# Patient Record
Sex: Female | Born: 1995 | Race: Black or African American | Hispanic: No | Marital: Married | Smoking: Never smoker
Health system: Southern US, Community
[De-identification: ages and names within clinical notes are randomized; demographics above are authoritative.]

## PROBLEM LIST (undated history)

## (undated) ENCOUNTER — Inpatient Hospital Stay (HOSPITAL_COMMUNITY): Payer: Self-pay

## (undated) DIAGNOSIS — Z8669 Personal history of other diseases of the nervous system and sense organs: Secondary | ICD-10-CM

## (undated) DIAGNOSIS — O24419 Gestational diabetes mellitus in pregnancy, unspecified control: Secondary | ICD-10-CM

## (undated) DIAGNOSIS — K219 Gastro-esophageal reflux disease without esophagitis: Secondary | ICD-10-CM

## (undated) DIAGNOSIS — J45909 Unspecified asthma, uncomplicated: Secondary | ICD-10-CM

## (undated) DIAGNOSIS — D573 Sickle-cell trait: Secondary | ICD-10-CM

## (undated) DIAGNOSIS — H35109 Retinopathy of prematurity, unspecified, unspecified eye: Secondary | ICD-10-CM

## (undated) DIAGNOSIS — O1490 Unspecified pre-eclampsia, unspecified trimester: Secondary | ICD-10-CM

## (undated) DIAGNOSIS — J219 Acute bronchiolitis, unspecified: Secondary | ICD-10-CM

## (undated) HISTORY — DX: Personal history of other diseases of the nervous system and sense organs: Z86.69

## (undated) HISTORY — PX: WISDOM TOOTH EXTRACTION: SHX21

## (undated) HISTORY — DX: Retinopathy of prematurity, unspecified, unspecified eye: H35.109

## (undated) HISTORY — PX: HERNIA REPAIR: SHX51

## (undated) HISTORY — PX: INGUINAL HERNIA PEDIATRIC WITH LAPAROSCOPIC EXAM: SHX5643

## (undated) HISTORY — PX: REFRACTIVE SURGERY: SHX103

---
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
## 2013-06-25 ENCOUNTER — Emergency Department (HOSPITAL_COMMUNITY)
Admission: EM | Admit: 2013-06-25 | Discharge: 2013-06-25 | Disposition: A | Discharge status: Home / Self Care | Payer: BC Managed Care – PPO | Attending: Emergency Medicine | Admitting: Emergency Medicine

## 2013-06-25 ENCOUNTER — Encounter (HOSPITAL_COMMUNITY): Payer: Self-pay | Admitting: Emergency Medicine

## 2013-06-25 DIAGNOSIS — IMO0002 Reserved for concepts with insufficient information to code with codable children: Secondary | ICD-10-CM | POA: Insufficient documentation

## 2013-06-25 DIAGNOSIS — R109 Unspecified abdominal pain: Secondary | ICD-10-CM | POA: Insufficient documentation

## 2013-06-25 DIAGNOSIS — R000 Tachycardia, unspecified: Secondary | ICD-10-CM | POA: Insufficient documentation

## 2013-06-25 DIAGNOSIS — R197 Diarrhea, unspecified: Secondary | ICD-10-CM | POA: Insufficient documentation

## 2013-06-25 DIAGNOSIS — Z792 Long term (current) use of antibiotics: Secondary | ICD-10-CM | POA: Insufficient documentation

## 2013-06-25 DIAGNOSIS — Z3202 Encounter for pregnancy test, result negative: Secondary | ICD-10-CM | POA: Insufficient documentation

## 2013-06-25 DIAGNOSIS — R112 Nausea with vomiting, unspecified: Secondary | ICD-10-CM

## 2013-06-25 DIAGNOSIS — Z8669 Personal history of other diseases of the nervous system and sense organs: Secondary | ICD-10-CM | POA: Insufficient documentation

## 2013-06-25 DIAGNOSIS — Z8719 Personal history of other diseases of the digestive system: Secondary | ICD-10-CM | POA: Insufficient documentation

## 2013-06-25 DIAGNOSIS — Z8679 Personal history of other diseases of the circulatory system: Secondary | ICD-10-CM | POA: Insufficient documentation

## 2013-06-25 DIAGNOSIS — Z79899 Other long term (current) drug therapy: Secondary | ICD-10-CM | POA: Insufficient documentation

## 2013-06-25 DIAGNOSIS — J45909 Unspecified asthma, uncomplicated: Secondary | ICD-10-CM | POA: Insufficient documentation

## 2013-06-25 LAB — CBC WITH DIFFERENTIAL/PLATELET
BASOS PCT: 0 % (ref 0–1)
Basophils Absolute: 0 10*3/uL (ref 0.0–0.1)
EOS PCT: 1 % (ref 0–5)
Eosinophils Absolute: 0.1 10*3/uL (ref 0.0–0.7)
HEMATOCRIT: 39.4 % (ref 36.0–46.0)
HEMOGLOBIN: 13.9 g/dL (ref 12.0–15.0)
Lymphocytes Relative: 8 % — ABNORMAL LOW (ref 12–46)
Lymphs Abs: 0.7 10*3/uL (ref 0.7–4.0)
MCH: 30.5 pg (ref 26.0–34.0)
MCHC: 35.3 g/dL (ref 30.0–36.0)
MCV: 86.6 fL (ref 78.0–100.0)
MONO ABS: 0.6 10*3/uL (ref 0.1–1.0)
MONOS PCT: 6 % (ref 3–12)
NEUTROS ABS: 8 10*3/uL — AB (ref 1.7–7.7)
Neutrophils Relative %: 86 % — ABNORMAL HIGH (ref 43–77)
Platelets: 248 10*3/uL (ref 150–400)
RBC: 4.55 MIL/uL (ref 3.87–5.11)
RDW: 12 % (ref 11.5–15.5)
WBC: 9.3 10*3/uL (ref 4.0–10.5)

## 2013-06-25 LAB — URINALYSIS, ROUTINE W REFLEX MICROSCOPIC
BILIRUBIN URINE: NEGATIVE
Glucose, UA: NEGATIVE mg/dL
KETONES UR: NEGATIVE mg/dL
Leukocytes, UA: NEGATIVE
NITRITE: NEGATIVE
PROTEIN: NEGATIVE mg/dL
Specific Gravity, Urine: 1.015 (ref 1.005–1.030)
UROBILINOGEN UA: 0.2 mg/dL (ref 0.0–1.0)
pH: 5.5 (ref 5.0–8.0)

## 2013-06-25 LAB — LIPASE, BLOOD: LIPASE: 26 U/L (ref 11–59)

## 2013-06-25 LAB — URINE MICROSCOPIC-ADD ON

## 2013-06-25 LAB — COMPREHENSIVE METABOLIC PANEL
ALBUMIN: 4 g/dL (ref 3.5–5.2)
ALT: 25 U/L (ref 0–35)
AST: 22 U/L (ref 0–37)
Alkaline Phosphatase: 67 U/L (ref 39–117)
BILIRUBIN TOTAL: 0.8 mg/dL (ref 0.3–1.2)
BUN: 13 mg/dL (ref 6–23)
CALCIUM: 9.1 mg/dL (ref 8.4–10.5)
CHLORIDE: 103 meq/L (ref 96–112)
CO2: 23 meq/L (ref 19–32)
CREATININE: 0.96 mg/dL (ref 0.50–1.10)
GFR calc Af Amer: >90 mL/min (ref >90)
GFR, EST NON AFRICAN AMERICAN: 85 mL/min — AB (ref >90)
Glucose, Bld: 109 mg/dL — ABNORMAL HIGH (ref 70–99)
Potassium: 3.9 mEq/L (ref 3.7–5.3)
Sodium: 140 mEq/L (ref 137–147)
Total Protein: 8.2 g/dL (ref 6.0–8.3)

## 2013-06-25 LAB — POC URINE PREG, ED: PREG TEST UR: NEGATIVE

## 2013-06-25 MED ORDER — KETOROLAC TROMETHAMINE 30 MG/ML IJ SOLN
30.0000 mg | Freq: Once | INTRAMUSCULAR | Status: AC
  Administered 2013-06-25: 30 mg via INTRAVENOUS
  Filled 2013-06-25: qty 1

## 2013-06-25 MED ORDER — SODIUM CHLORIDE 0.9 % IV SOLN
1000.0000 mL | Freq: Once | INTRAVENOUS | Status: AC
  Administered 2013-06-25: 1000 mL via INTRAVENOUS

## 2013-06-25 MED ORDER — ONDANSETRON HCL 4 MG/2ML IJ SOLN
4.0000 mg | Freq: Once | INTRAMUSCULAR | Status: AC
  Administered 2013-06-25: 4 mg via INTRAVENOUS
  Filled 2013-06-25: qty 2

## 2013-06-25 MED ORDER — ONDANSETRON HCL 4 MG PO TABS: 4.0000 mg | ORAL_TABLET | Freq: Four times a day (QID) | ORAL | Status: DC

## 2013-06-25 NOTE — ED Provider Notes (Signed)
CSN: [id number]     Arrival date & time 06/25/13  1412 History   First MD Initiated Contact with Patient 06/25/13 1503     Chief Complaint  Patient presents with  . Abdominal Pain  . Emesis     (Consider location/radiation/quality/duration/timing/severity/associated sxs/prior Treatment) HPI  18 year old female with hx of GERD, migraine, asthma, presents c/o abd pain.  Pt reports for the past 3 days she has had intermittent headache. Described as sharp throbbing sensation affecting forehead similar to her usual tension headache.  Also report having sinus congestion which seems to worsen her headache.  She also report feeling nauseous since yesterday but today she has had >5 bouts of non bloody non bilious vomit and having loose stool.  She also currently on her 2nd day of menstrual cycle and endorse mild abdominal cramping.  Report chills.  Her primary concern is nausea and vomiting.  Has appetite but doesn't want to eat.  Denies neck stiffness, sneeze, cough, hemoptysis, back pain, dysuria, hematuria.  Only treatment tried is eating ice and drinking room temperature water and diet coke.    Past Medical History  Diagnosis Date  . ROP (retinopathy of prematurity)   . Hx of migraines   . Bronchiolitis   . Asthma   . GERD (gastroesophageal reflux disease)    Past Surgical History  Procedure Laterality Date  . Refractive surgery    . Wisdom tooth extraction    . Inguinal hernia pediatric with laparoscopic exam     Family History  Problem Relation Age of Onset  . Sickle cell trait Mother   . Hypertension Mother   . Diabetes Mother   . Deep vein thrombosis Mother   . Heart disease Other    History  Substance Use Topics  . Smoking status: Never Smoker   . Smokeless tobacco: Never Used  . Alcohol Use: No   OB History   Grav Para Term Preterm Abortions TAB SAB Ect Mult Living   0              Review of Systems  All other systems reviewed and are negative.     Allergies   Shellfish allergy  Home Medications   Prior to Admission medications   Medication Sig Start Date End Date Taking? Authorizing Provider  albuterol (PROVENTIL) (2.5 MG/3ML) 0.083% nebulizer solution Take 2.5 mg by nebulization every 6 (six) hours as needed.    Historical Provider, MD  azithromycin (ZITHROMAX) 250 MG tablet Take 1 tablet (250 mg total) by mouth daily. Take first 2 tablets together, then 1 every day until finished. 11/29/12   Hannah Muthersbaugh, PA-C  fluticasone (FLONASE) 50 MCG/ACT nasal spray Place 2 sprays into the nose daily. 11/29/12   Hannah Muthersbaugh, PA-C  medroxyPROGESTERone (DEPO-PROVERA) 150 MG/ML injection Inject 150 mg into the muscle every 3 (three) months.    Historical Provider, MD  predniSONE (DELTASONE) 20 MG tablet Take 2 tablets (40 mg total) by mouth daily. 11/29/12   Hannah Muthersbaugh, PA-C   BP 128/69  Pulse 128  Temp(Src) 98.8 F (37.1 C) (Oral)  Resp 16  SpO2 100% Physical Exam  Nursing note and vitals reviewed. Constitutional: She is oriented to person, place, and time. She appears well-developed and well-nourished. No distress.  HENT:  Head: Atraumatic.  Mouth/Throat: Oropharynx is clear and moist.  Eyes: Conjunctivae are normal.  Neck: Neck supple.  No nuchal rigidity  Cardiovascular:  Tachycardia without M/R/G  Pulmonary/Chest: Effort normal and breath sounds normal. No respiratory  distress. She has no rales. She exhibits no tenderness.  Abdominal: Soft. Bowel sounds are normal. She exhibits no distension. There is no tenderness. There is no rebound and no guarding.  Musculoskeletal: Normal range of motion.  Neurological: She is alert and oriented to person, place, and time.  Skin: No rash noted.  Psychiatric: She has a normal mood and affect.    ED Course  Procedures (including critical care time)  3:18 PM Pt with sxs suggestive of viral syndrome.  She has a benign abdomen.  Is tachycardic, IVF given, zofran for nausea.  Will  treat sxs.    4:05 PM Labs are reassuring.  UA with moderate Hgb, likely contaminant as pt currently on her menstruation.  Pt felt better and able to tolerates PO.  Pt felt comfortable to be discharge.  Will f/u with PCP, return precaution discussed.   Labs Review Labs Reviewed  CBC WITH DIFFERENTIAL - Abnormal; Notable for the following:    Neutrophils Relative % 86 (*)    Neutro Abs 8.0 (*)    Lymphocytes Relative 8 (*)    All other components within normal limits  COMPREHENSIVE METABOLIC PANEL - Abnormal; Notable for the following:    Glucose, Bld 109 (*)    GFR calc non Af Amer 85 (*)    All other components within normal limits  URINALYSIS, ROUTINE W REFLEX MICROSCOPIC - Abnormal; Notable for the following:    Hgb urine dipstick MODERATE (*)    All other components within normal limits  URINE MICROSCOPIC-ADD ON - Abnormal; Notable for the following:    Bacteria, UA FEW (*)    All other components within normal limits  LIPASE, BLOOD  POC URINE PREG, ED    Imaging Review No results found.   EKG Interpretation None      MDM   Final diagnoses:  Nausea vomiting and diarrhea    BP 128/68  Pulse 97  Temp(Src) 98.8 F (37.1 C) (Oral)  Resp 16  SpO2 100%  I have reviewed nursing notes and vital signs. I reviewed available ER/hospitalization records thought the EMR     Fayrene HelperBowie Sharell Hilmer, [place] JerseyPA-C 06/25/13 1648

## 2013-06-25 NOTE — ED Notes (Signed)
Pt given sprite 

## 2013-06-25 NOTE — ED Provider Notes (Signed)
Medical screening examination/treatment/procedure(s) were performed by non-physician practitioner and as supervising physician I was immediately available for consultation/collaboration.   EKG Interpretation None        Benny LennertJoseph L Yehudit Fulginiti, MD 06/25/13 2232

## 2013-06-25 NOTE — ED Notes (Signed)
PA at bedside.

## 2013-06-25 NOTE — ED Notes (Signed)
Pt reports headache x3 days, abdominal pain x2 days pain 4/10, pt vomited x7 today. Also on menstrual cycle. Reports dizziness as well and sinus pressure.

## 2013-06-25 NOTE — Discharge Instructions (Signed)
Nausea and Vomiting °Nausea is a sick feeling that often comes before throwing up (vomiting). Vomiting is a reflex where stomach contents come out of your mouth. Vomiting can cause severe loss of body fluids (dehydration). Children and elderly adults can become dehydrated quickly, especially if they also have diarrhea. Nausea and vomiting are symptoms of a condition or disease. It is important to find the cause of your symptoms. °CAUSES  °· Direct irritation of the stomach lining. This irritation can result from increased acid production (gastroesophageal reflux disease), infection, food poisoning, taking certain medicines (such as nonsteroidal anti-inflammatory drugs), alcohol use, or tobacco use. °· Signals from the brain. These signals could be caused by a headache, heat exposure, an inner ear disturbance, increased pressure in the brain from injury, infection, a tumor, or a concussion, pain, emotional stimulus, or metabolic problems. °· An obstruction in the gastrointestinal tract (bowel obstruction). °· Illnesses such as diabetes, hepatitis, gallbladder problems, appendicitis, kidney problems, cancer, sepsis, atypical symptoms of a heart attack, or eating disorders. °· Medical treatments such as chemotherapy and radiation. °· Receiving medicine that makes you sleep (general anesthetic) during surgery. °DIAGNOSIS °Your caregiver may ask for tests to be done if the problems do not improve after a few days. Tests may also be done if symptoms are severe or if the reason for the nausea and vomiting is not clear. Tests may include: °· Urine tests. °· Blood tests. °· Stool tests. °· Cultures (to look for evidence of infection). °· X-rays or other imaging studies. °Test results can help your caregiver make decisions about treatment or the need for additional tests. °TREATMENT °You need to stay well hydrated. Drink frequently but in small amounts. You may wish to drink water, sports drinks, clear broth, or eat frozen  ice pops or gelatin dessert to help stay hydrated. When you eat, eating slowly may help prevent nausea. There are also some antinausea medicines that may help prevent nausea. °HOME CARE INSTRUCTIONS  °· Take all medicine as directed by your caregiver. °· If you do not have an appetite, do not force yourself to eat. However, you must continue to drink fluids. °· If you have an appetite, eat a normal diet unless your caregiver tells you differently. °· Eat a variety of complex carbohydrates (rice, wheat, potatoes, bread), lean meats, yogurt, fruits, and vegetables. °· Avoid high-fat foods because they are more difficult to digest. °· Drink enough water and fluids to keep your urine clear or pale yellow. °· If you are dehydrated, ask your caregiver for specific rehydration instructions. Signs of dehydration may include: °· Severe thirst. °· Dry lips and mouth. °· Dizziness. °· Dark urine. °· Decreasing urine frequency and amount. °· Confusion. °· Rapid breathing or pulse. °SEEK IMMEDIATE MEDICAL CARE IF:  °· You have blood or brown flecks (like coffee grounds) in your vomit. °· You have black or bloody stools. °· You have a severe headache or stiff neck. °· You are confused. °· You have severe abdominal pain. °· You have chest pain or trouble breathing. °· You do not urinate at least once every 8 hours. °· You develop cold or clammy skin. °· You continue to vomit for longer than 24 to 48 hours. °· You have a fever. °MAKE SURE YOU:  °· Understand these instructions. °· Will watch your condition. °· Will get help right away if you are not doing well or get worse. °Document Released: 02/07/2005 Document Revised: 05/02/2011 Document Reviewed: 07/07/2010 °[employer]® Patient Information ©2014 [employer]. ° °Viral Gastroenteritis °Viral gastroenteritis   is also known as stomach flu. This condition affects the stomach and intestinal tract. It can cause sudden diarrhea and vomiting. The illness typically lasts 3 to 8 days.  Most people develop an immune response that eventually gets rid of the virus. While this natural response develops, the virus can make you quite ill. °CAUSES  °Many different viruses can cause gastroenteritis, such as rotavirus or noroviruses. You can catch one of these viruses by consuming contaminated food or water. You may also catch a virus by sharing utensils or other personal items with an infected person or by touching a contaminated surface. °SYMPTOMS  °The most common symptoms are diarrhea and vomiting. These problems can cause a severe loss of body fluids (dehydration) and a body salt (electrolyte) imbalance. Other symptoms may include: °· Fever. °· Headache. °· Fatigue. °· Abdominal pain. °DIAGNOSIS  °Your caregiver can usually diagnose viral gastroenteritis based on your symptoms and a physical exam. A stool sample may also be taken to test for the presence of viruses or other infections. °TREATMENT  °This illness typically goes away on its own. Treatments are aimed at rehydration. The most serious cases of viral gastroenteritis involve vomiting so severely that you are not able to keep fluids down. In these cases, fluids must be given through an intravenous line (IV). °HOME CARE INSTRUCTIONS  °· Drink enough fluids to keep your urine clear or pale yellow. Drink small amounts of fluids frequently and increase the amounts as tolerated. °· Ask your caregiver for specific rehydration instructions. °· Avoid: °· Foods high in sugar. °· Alcohol. °· Carbonated drinks. °· Tobacco. °· Juice. °· Caffeine drinks. °· Extremely hot or cold fluids. °· Fatty, greasy foods. °· Too much intake of anything at one time. °· Dairy products until 24 to 48 hours after diarrhea stops. °· You may consume probiotics. Probiotics are active cultures of beneficial bacteria. They may lessen the amount and number of diarrheal stools in adults. Probiotics can be found in yogurt with active cultures and in supplements. °· Wash your  hands well to avoid spreading the virus. °· Only take over-the-counter or prescription medicines for pain, discomfort, or fever as directed by your caregiver. Do not give aspirin to children. Antidiarrheal medicines are not recommended. °· Ask your caregiver if you should continue to take your regular prescribed and over-the-counter medicines. °· Keep all follow-up appointments as directed by your caregiver. °SEEK IMMEDIATE MEDICAL CARE IF:  °· You are unable to keep fluids down. °· You do not urinate at least once every 6 to 8 hours. °· You develop shortness of breath. °· You notice blood in your stool or vomit. This may look like coffee grounds. °· You have abdominal pain that increases or is concentrated in one small area (localized). °· You have persistent vomiting or diarrhea. °· You have a fever. °· The patient is a child younger than 3 months, and he or she has a fever. °· The patient is a child older than 3 months, and he or she has a fever and persistent symptoms. °· The patient is a child older than 3 months, and he or she has a fever and symptoms suddenly get worse. °· The patient is a baby, and he or she has no tears when crying. °MAKE SURE YOU:  °· Understand these instructions. °· Will watch your condition. °· Will get help right away if you are not doing well or get worse. °Document Released: 02/07/2005 Document Revised: 05/02/2011 Document Reviewed: 11/24/2010 °[employer]® Patient Information ©  2014 [employer]. ° °

## 2013-10-26 ENCOUNTER — Encounter (HOSPITAL_COMMUNITY): Payer: Self-pay | Admitting: Emergency Medicine

## 2013-10-26 ENCOUNTER — Emergency Department (HOSPITAL_COMMUNITY)
Admission: EM | Admit: 2013-10-26 | Discharge: 2013-10-26 | Disposition: A | Discharge status: Home / Self Care | Payer: BC Managed Care – PPO | Attending: Emergency Medicine | Admitting: Emergency Medicine

## 2013-10-26 DIAGNOSIS — K219 Gastro-esophageal reflux disease without esophagitis: Secondary | ICD-10-CM | POA: Insufficient documentation

## 2013-10-26 DIAGNOSIS — R05 Cough: Secondary | ICD-10-CM | POA: Diagnosis present

## 2013-10-26 DIAGNOSIS — J209 Acute bronchitis, unspecified: Secondary | ICD-10-CM

## 2013-10-26 DIAGNOSIS — J45901 Unspecified asthma with (acute) exacerbation: Secondary | ICD-10-CM | POA: Diagnosis not present

## 2013-10-26 DIAGNOSIS — Z79899 Other long term (current) drug therapy: Secondary | ICD-10-CM | POA: Diagnosis not present

## 2013-10-26 DIAGNOSIS — Z8669 Personal history of other diseases of the nervous system and sense organs: Secondary | ICD-10-CM | POA: Diagnosis not present

## 2013-10-26 DIAGNOSIS — R059 Cough, unspecified: Secondary | ICD-10-CM | POA: Diagnosis present

## 2013-10-26 MED ORDER — PREDNISONE 10 MG PO TABS: 20.0000 mg | ORAL_TABLET | Freq: Two times a day (BID) | ORAL | Status: DC

## 2013-10-26 MED ORDER — ALBUTEROL SULFATE (2.5 MG/3ML) 0.083% IN NEBU
5.0000 mg | INHALATION_SOLUTION | Freq: Once | RESPIRATORY_TRACT | Status: AC
  Administered 2013-10-26: 5 mg via RESPIRATORY_TRACT
  Filled 2013-10-26: qty 6

## 2013-10-26 MED ORDER — AMOXICILLIN 500 MG PO CAPS: 500.0000 mg | ORAL_CAPSULE | Freq: Three times a day (TID) | ORAL | Status: DC

## 2013-10-26 NOTE — ED Provider Notes (Signed)
CSN: [id number]     Arrival date & time 10/26/13  0037 History   First MD Initiated Contact with Patient 10/26/13 0401     Chief Complaint  Patient presents with  . URI     (Consider location/radiation/quality/duration/timing/severity/associated sxs/prior Treatment) HPI Comments: Patient is a 18 year old female with history of asthma. She presents with complaints of chest congestion and productive cough for the past 5 days. She reports several ill contacts at school. She denies fevers or chills.  Patient is a 18 y.o. female presenting with URI. The history is provided by the patient.  URI Presenting symptoms: congestion, cough, fever, rhinorrhea and sore throat   Severity:  Moderate Onset quality:  Gradual Duration:  5 days Timing:  Constant Progression:  Worsening Chronicity:  New Relieved by:  Nothing Worsened by:  Nothing tried Ineffective treatments:  None tried   Past Medical History  Diagnosis Date  . ROP (retinopathy of prematurity)   . Hx of migraines   . Bronchiolitis   . Asthma   . GERD (gastroesophageal reflux disease)    Past Surgical History  Procedure Laterality Date  . Refractive surgery    . Wisdom tooth extraction    . Inguinal hernia pediatric with laparoscopic exam     Family History  Problem Relation Age of Onset  . Sickle cell trait Mother   . Hypertension Mother   . Diabetes Mother   . Deep vein thrombosis Mother   . Heart disease Other    History  Substance Use Topics  . Smoking status: Never Smoker   . Smokeless tobacco: Never Used  . Alcohol Use: No   OB History   Grav Para Term Preterm Abortions TAB SAB Ect Mult Living   0              Review of Systems  Constitutional: Positive for fever.  HENT: Positive for congestion, rhinorrhea and sore throat.   Respiratory: Positive for cough.   All other systems reviewed and are negative.     Allergies  Shellfish allergy and Bee venom  Home Medications   Prior to Admission  medications   Medication Sig Start Date End Date Taking? Authorizing Provider  albuterol (PROVENTIL HFA;VENTOLIN HFA) 108 (90 BASE) MCG/ACT inhaler Inhale 2 puffs into the lungs every 6 (six) hours as needed for wheezing or shortness of breath.   Yes Historical Provider, MD  Multiple Vitamin (MULTIVITAMIN WITH MINERALS) TABS tablet Take 1 tablet by mouth daily.   Yes Historical Provider, MD  omeprazole (PRILOSEC OTC) 20 MG tablet Take 20 mg by mouth daily.   Yes Historical Provider, MD  diphenhydramine-acetaminophen (TYLENOL PM) 25-500 MG TABS Take 2 tablets by mouth at bedtime as needed.    Historical Provider, MD  EPINEPHrine (EPIPEN) 0.3 mg/0.3 mL IJ SOAJ injection Inject 0.3 mg into the muscle once.    Historical Provider, MD  ibuprofen (ADVIL,MOTRIN) 200 MG tablet Take 400 mg by mouth every 6 (six) hours as needed for moderate pain.    Historical Provider, MD  pseudoephedrine (SUDAFED) 30 MG tablet Take 30 mg by mouth every 4 (four) hours as needed for congestion.    Historical Provider, MD   BP 148/90  Pulse 92  Temp(Src) 99.4 F (37.4 C) (Oral)  Resp 16  Ht  (1.651 m)  Wt 187 lb (84.823 kg)  BMI 31.12 kg/m2  SpO2 100%  LMP 10/07/2013 Physical Exam  Nursing note and vitals reviewed. Constitutional: She is oriented to person, place, and time.  She appears well-developed and well-nourished. No distress.  HENT:  Head: Normocephalic and atraumatic.  Neck: Normal range of motion. Neck supple.  Cardiovascular: Normal rate and regular rhythm.  Exam reveals no gallop and no friction rub.   No murmur heard. Pulmonary/Chest: Effort normal. No respiratory distress. She has wheezes.  There are mild expiratory wheezes bilaterally.  Abdominal: Soft. Bowel sounds are normal. She exhibits no distension. There is no tenderness.  Musculoskeletal: Normal range of motion.  Neurological: She is alert and oriented to person, place, and time.  Skin: Skin is warm and dry. She is not diaphoretic.     ED Course  Procedures (including critical care time) Labs Review Labs Reviewed - No data to display  Imaging Review No results found.   EKG Interpretation None      MDM   Final diagnoses:  None    Patient with history of asthma and recurrent bronchitis. She presents with complaints of wheezing, chest congestion, productive cough, difficulty breathing. She was given an albuterol treatment with some relief. He states when she has these episodes she typically gets an antibiotic and a short course of prednisone, both of which will be provided to her. She will be discharged to home and understands to return if her symptoms worsen or change. Her vitals are stable and oxygen saturations are 100%.    Geoffery Lyons, MD 10/26/13 [phone]

## 2013-10-26 NOTE — ED Notes (Signed)
Pt presents with nasal congestion, drainage, sore throat, cough and fever x 2 days. Pt has used MDI at home and continues to have diff breathing, NAD, A & O

## 2013-10-26 NOTE — Discharge Instructions (Signed)
Amoxicillin and prednisone as prescribed.  Continue your inhaler, 2 puffs every 4 hours as needed for wheezing.  Return to the emergency department for severe chest pain, difficulty breathing, or other new and concerning symptoms.   Acute Bronchitis Bronchitis is inflammation of the airways that extend from the windpipe into the lungs (bronchi). The inflammation often causes mucus to develop. This leads to a cough, which is the most common symptom of bronchitis.  In acute bronchitis, the condition usually develops suddenly and goes away over time, usually in a couple weeks. Smoking, allergies, and asthma can make bronchitis worse. Repeated episodes of bronchitis may cause further lung problems.  CAUSES Acute bronchitis is most often caused by the same virus that causes a cold. The virus can spread from person to person (contagious) through coughing, sneezing, and touching contaminated objects. SIGNS AND SYMPTOMS   Cough.   Fever.   Coughing up mucus.   Body aches.   Chest congestion.   Chills.   Shortness of breath.   Sore throat.  DIAGNOSIS  Acute bronchitis is usually diagnosed through a physical exam. Your health care provider will also ask you questions about your medical history. Tests, such as chest X-rays, are sometimes done to rule out other conditions.  TREATMENT  Acute bronchitis usually goes away in a couple weeks. Oftentimes, no medical treatment is necessary. Medicines are sometimes given for relief of fever or cough. Antibiotic medicines are usually not needed but may be prescribed in certain situations. In some cases, an inhaler may be recommended to help reduce shortness of breath and control the cough. A cool mist vaporizer may also be used to help thin bronchial secretions and make it easier to clear the chest.  HOME CARE INSTRUCTIONS  Get plenty of rest.   Drink enough fluids to keep your urine clear or pale yellow (unless you have a medical condition  that requires fluid restriction). Increasing fluids may help thin your respiratory secretions (sputum) and reduce chest congestion, and it will prevent dehydration.   Take medicines only as directed by your health care provider.  If you were prescribed an antibiotic medicine, finish it all even if you start to feel better.  Avoid smoking and secondhand smoke. Exposure to cigarette smoke or irritating chemicals will make bronchitis worse. If you are a smoker, consider using nicotine gum or skin patches to help control withdrawal symptoms. Quitting smoking will help your lungs heal faster.   Reduce the chances of another bout of acute bronchitis by washing your hands frequently, avoiding people with cold symptoms, and trying not to touch your hands to your mouth, nose, or eyes.   Keep all follow-up visits as directed by your health care provider.  SEEK MEDICAL CARE IF: Your symptoms do not improve after 1 week of treatment.  SEEK IMMEDIATE MEDICAL CARE IF:  You develop an increased fever or chills.   You have chest pain.   You have severe shortness of breath.  You have bloody sputum.   You develop dehydration.  You faint or repeatedly feel like you are going to pass out.  You develop repeated vomiting.  You develop a severe headache. MAKE SURE YOU:   Understand these instructions.  Will watch your condition.  Will get help right away if you are not doing well or get worse. Document Released: 03/17/2004 Document Revised: 06/24/2013 Document Reviewed: 07/31/2012 [facility] Patient Information 2015 [place], [place]. This information is not intended to replace advice given to you by your health care  provider. Make sure you discuss any questions you have with your health care provider. ° °

## 2014-01-31 ENCOUNTER — Emergency Department (HOSPITAL_BASED_OUTPATIENT_CLINIC_OR_DEPARTMENT_OTHER): Payer: BC Managed Care – PPO

## 2014-01-31 ENCOUNTER — Encounter (HOSPITAL_BASED_OUTPATIENT_CLINIC_OR_DEPARTMENT_OTHER): Payer: Self-pay | Admitting: *Deleted

## 2014-01-31 ENCOUNTER — Emergency Department (HOSPITAL_BASED_OUTPATIENT_CLINIC_OR_DEPARTMENT_OTHER)
Admission: EM | Admit: 2014-01-31 | Discharge: 2014-01-31 | Disposition: A | Discharge status: Home / Self Care | Payer: BC Managed Care – PPO | Attending: Emergency Medicine | Admitting: Emergency Medicine

## 2014-01-31 DIAGNOSIS — S99922A Unspecified injury of left foot, initial encounter: Secondary | ICD-10-CM | POA: Diagnosis not present

## 2014-01-31 DIAGNOSIS — Z8669 Personal history of other diseases of the nervous system and sense organs: Secondary | ICD-10-CM | POA: Diagnosis not present

## 2014-01-31 DIAGNOSIS — Z7952 Long term (current) use of systemic steroids: Secondary | ICD-10-CM | POA: Insufficient documentation

## 2014-01-31 DIAGNOSIS — R000 Tachycardia, unspecified: Secondary | ICD-10-CM | POA: Diagnosis not present

## 2014-01-31 DIAGNOSIS — K219 Gastro-esophageal reflux disease without esophagitis: Secondary | ICD-10-CM | POA: Diagnosis not present

## 2014-01-31 DIAGNOSIS — Z8679 Personal history of other diseases of the circulatory system: Secondary | ICD-10-CM | POA: Diagnosis not present

## 2014-01-31 DIAGNOSIS — J45909 Unspecified asthma, uncomplicated: Secondary | ICD-10-CM | POA: Insufficient documentation

## 2014-01-31 DIAGNOSIS — Y9289 Other specified places as the place of occurrence of the external cause: Secondary | ICD-10-CM | POA: Insufficient documentation

## 2014-01-31 DIAGNOSIS — Z79899 Other long term (current) drug therapy: Secondary | ICD-10-CM | POA: Insufficient documentation

## 2014-01-31 DIAGNOSIS — Z792 Long term (current) use of antibiotics: Secondary | ICD-10-CM | POA: Diagnosis not present

## 2014-01-31 DIAGNOSIS — X58XXXA Exposure to other specified factors, initial encounter: Secondary | ICD-10-CM | POA: Diagnosis not present

## 2014-01-31 DIAGNOSIS — T1490XA Injury, unspecified, initial encounter: Secondary | ICD-10-CM

## 2014-01-31 DIAGNOSIS — Y9301 Activity, walking, marching and hiking: Secondary | ICD-10-CM | POA: Insufficient documentation

## 2014-01-31 DIAGNOSIS — Y998 Other external cause status: Secondary | ICD-10-CM | POA: Diagnosis not present

## 2014-01-31 DIAGNOSIS — R52 Pain, unspecified: Secondary | ICD-10-CM

## 2014-01-31 MED ORDER — ALBUTEROL SULFATE HFA 108 (90 BASE) MCG/ACT IN AERS: 2.0000 | INHALATION_SPRAY | Freq: Four times a day (QID) | RESPIRATORY_TRACT | Status: DC | PRN

## 2014-01-31 MED ORDER — ALBUTEROL SULFATE HFA 108 (90 BASE) MCG/ACT IN AERS: 2.0000 | INHALATION_SPRAY | RESPIRATORY_TRACT | Status: DC | PRN

## 2014-01-31 MED ORDER — EPINEPHRINE 0.3 MG/0.3ML IJ SOAJ: 0.3000 mg | Freq: Once | INTRAMUSCULAR | Status: DC

## 2014-01-31 MED ORDER — ACETAMINOPHEN 500 MG PO TABS
1000.0000 mg | ORAL_TABLET | Freq: Once | ORAL | Status: AC
  Administered 2014-01-31: 1000 mg via ORAL
  Filled 2014-01-31: qty 2

## 2014-01-31 NOTE — ED Notes (Signed)
Pt c/o left foot pain that began hurting yesterday. Pt was walking and had a sudden sharp pain that has been getting worse.

## 2014-01-31 NOTE — ED Provider Notes (Signed)
CSN: [id number]     Arrival date & time 01/31/14  [id number] History   None    Chief Complaint  Patient presents with  . Foot Pain     (Consider location/radiation/quality/duration/timing/severity/associated sxs/prior Treatment) HPI Patient stepped off of a curb yesterday since the event she complains of pain at left foot which is worse with walking improved with no weightbearing. No treatment prior to coming here. No other injury pain is sharp. Worse this morning. No other associated symptoms. Past Medical History  Diagnosis Date  . ROP (retinopathy of prematurity)   . Hx of migraines   . Bronchiolitis   . Asthma   . GERD (gastroesophageal reflux disease)    Past Surgical History  Procedure Laterality Date  . Refractive surgery    . Wisdom tooth extraction    . Inguinal hernia pediatric with laparoscopic exam     Family History  Problem Relation Age of Onset  . Sickle cell trait Mother   . Hypertension Mother   . Diabetes Mother   . Deep vein thrombosis Mother   . Heart disease Other    History  Substance Use Topics  . Smoking status: Never Smoker   . Smokeless tobacco: Never Used  . Alcohol Use: No   OB History    Gravida Para Term Preterm AB TAB SAB Ectopic Multiple Living   0              Review of Systems  HENT: Negative.   Musculoskeletal: Positive for arthralgias and gait problem.       Left foot pain      Allergies  Shellfish allergy and Bee venom  Home Medications   Prior to Admission medications   Medication Sig Start Date End Date Taking? Authorizing Provider  albuterol (PROVENTIL HFA;VENTOLIN HFA) 108 (90 BASE) MCG/ACT inhaler Inhale 2 puffs into the lungs every 6 (six) hours as needed for wheezing or shortness of breath.    Historical Provider, MD  amoxicillin (AMOXIL) 500 MG capsule Take 1 capsule (500 mg total) by mouth 3 (three) times daily. 10/26/13   Geoffery Lyonsouglas Delo, MD  diphenhydramine-acetaminophen (TYLENOL PM) 25-500 MG TABS Take 2 tablets by  mouth at bedtime as needed.    Historical Provider, MD  EPINEPHrine (EPIPEN) 0.3 mg/0.3 mL IJ SOAJ injection Inject 0.3 mg into the muscle once.    Historical Provider, MD  ibuprofen (ADVIL,MOTRIN) 200 MG tablet Take 400 mg by mouth every 6 (six) hours as needed for moderate pain.    Historical Provider, MD  Multiple Vitamin (MULTIVITAMIN WITH MINERALS) TABS tablet Take 1 tablet by mouth daily.    Historical Provider, MD  omeprazole (PRILOSEC OTC) 20 MG tablet Take 20 mg by mouth daily.    Historical Provider, MD  predniSONE (DELTASONE) 10 MG tablet Take 2 tablets (20 mg total) by mouth 2 (two) times daily. 10/26/13   Geoffery Lyonsouglas Delo, MD  pseudoephedrine (SUDAFED) 30 MG tablet Take 30 mg by mouth every 4 (four) hours as needed for congestion.    Historical Provider, MD   BP 132/83 mmHg  Pulse 101  Temp(Src) 98.8 F (37.1 C) (Oral)  Resp 16  Ht 5\' 5"  (1.651 m)  Wt 187 lb (84.823 kg)  BMI 31.12 kg/m2  SpO2 100%  LMP 01/25/2014 Physical Exam  Constitutional: She appears well-developed and well-nourished. No distress.  HENT:  Head: Normocephalic and atraumatic.  Eyes: EOM are normal.  Neck: Neck supple. No tracheal deviation present. No thyromegaly present.  Cardiovascular: Regular rhythm.  Mildly tachycardic  Pulmonary/Chest: Effort normal.  Abdominal: Soft. She exhibits no distension.  Musculoskeletal: Normal range of motion. She exhibits no edema.  Left lower extremity no redness no swelling. She is mildly tender at the arch of her foot. DP pulse 2+ good capillary refill. All other extremity is a redness or tenderness neurovascular intact  Neurological: She is alert. No cranial nerve deficit. Coordination normal.  Walks with a limp favoring left leg  Skin: Skin is warm and dry. No rash noted.  Psychiatric: She has a normal mood and affect.  Nursing note and vitals reviewed.   ED Course  Procedures (including critical care time) Labs Review Labs Reviewed - No data to  display  Imaging Review Dg Foot Complete Left  01/31/2014   CLINICAL DATA:  Injured foot stepping off a curb yesterday.  EXAM: LEFT FOOT - COMPLETE 3+ VIEW  COMPARISON:  None.  FINDINGS: The joint spaces are maintained. No acute fractures identified. Mild/ early hallux valgus deformity.  IMPRESSION: No acute fracture.   Electronically Signed   By: Loralie ChampagneMark  Gallerani M.D.   On: 01/31/2014 09:40     EKG Interpretation None     X-ray viewed by me MDM  Suspect sprain is sudden onset pain while walking off a curb Final diagnoses:  Pain  Injury   Plan Tylenol crutches, [place] prescriptions for EpiPen and albuterol HFA as patient requests refills. Referral Dr. Pearletha ForgeHudnall if significant pain or difficulty walking by next week Diagnosis #1 left foot sprain #2 medication refill    Doug SouSam Hadas Jessop, MD 01/31/14 [phone]

## 2014-01-31 NOTE — Discharge Instructions (Signed)
Use your crutches to keep weight off your foot. Take Tylenol for pain. Call Dr.Hudnall if you continue to have difficulty walking or significant pain in your foot by next week   Foot Sprain The muscles and cord like structures which attach muscle to bone (tendons) that surround the feet are made up of units. A foot sprain can occur at the weakest spot in any of these units. This condition is most often caused by injury to or overuse of the foot, as from playing contact sports, or aggravating a previous injury, or from poor conditioning, or obesity. SYMPTOMS  Pain with movement of the foot.  Tenderness and swelling at the injury site.  Loss of strength is present in moderate or severe sprains. THE THREE GRADES OR SEVERITY OF FOOT SPRAIN ARE:  Mild (Grade I): Slightly pulled muscle without tearing of muscle or tendon fibers or loss of strength.  Moderate (Grade II): Tearing of fibers in a muscle, tendon, or at the attachment to bone, with small decrease in strength.  Severe (Grade III): Rupture of the muscle-tendon-bone attachment, with separation of fibers. Severe sprain requires surgical repair. Often repeating (chronic) sprains are caused by overuse. Sudden (acute) sprains are caused by direct injury or over-use. DIAGNOSIS  Diagnosis of this condition is usually by your own observation. If problems continue, a caregiver may be required for further evaluation and treatment. X-rays may be required to make sure there are not breaks in the bones (fractures) present. Continued problems may require physical therapy for treatment. PREVENTION  Use strength and conditioning exercises appropriate for your sport.  Warm up properly prior to working out.  Use athletic shoes that are made for the sport you are participating in.  Allow adequate time for healing. Early return to activities makes repeat injury more likely, and can lead to an unstable arthritic foot that can result in prolonged  disability. Mild sprains generally heal in 3 to 10 days, with moderate and severe sprains taking 2 to 10 weeks. Your caregiver can help you determine the proper time required for healing. HOME CARE INSTRUCTIONS   Apply ice to the injury for 15-20 minutes, 03-04 times per day. Put the ice in a plastic bag and place a towel between the bag of ice and your skin.  An elastic wrap (like an Ace bandage) may be used to keep swelling down.  Keep foot above the level of the heart, or at least raised on a footstool, when swelling and pain are present.  Try to avoid use other than gentle range of motion while the foot is painful. Do not resume use until instructed by your caregiver. Then begin use gradually, not increasing use to the point of pain. If pain does develop, decrease use and continue the above measures, gradually increasing activities that do not cause discomfort, until you gradually achieve normal use.  Use crutches if and as instructed, and for the length of time instructed.  Keep injured foot and ankle wrapped between treatments.  Massage foot and ankle for comfort and to keep swelling down. Massage from the toes up towards the knee.  Only take over-the-counter or prescription medicines for pain, discomfort, or fever as directed by your caregiver. SEEK IMMEDIATE MEDICAL CARE IF:   Your pain and swelling increase, or pain is not controlled with medications.  You have loss of feeling in your foot or your foot turns cold or blue.  You develop new, unexplained symptoms, or an increase of the symptoms that brought you  to your caregiver. MAKE SURE YOU:   Understand these instructions.  Will watch your condition.  Will get help right away if you are not doing well or get worse. Document Released: 07/30/2001 Document Revised: 05/02/2011 Document Reviewed: 09/27/2007 [facility] Patient Information 2015 [place], [place]. This information is not intended to replace advice given to you by your  health care provider. Make sure you discuss any questions you have with your health care provider. Crutch Use Crutches are used to take weight off one of your legs or feet when you stand or walk. It is important to use crutches that fit properly. When fitted properly:  Each crutch should be 2-3 finger widths below the armpit.  Your weight should be supported by your hand, and not by resting the armpit on the crutch.  RISKS AND COMPLICATIONS Damage to the nerves that extend from your armpit to your hand and arm. To prevent this from happening, make sure your crutches fit properly and do not put pressure on your armpit when using them. HOW TO USE YOUR CRUTCHES If you have been instructed to use partial weight bearing, apply (bear) the amount of weight as your health care provider suggests. Do not bear weight in an amount that causes pain to the area of injury. Walking  Step with the crutches.  Swing the healthy leg slightly ahead of the crutches. Going Up Steps If there is no handrail:  Step up with the healthy leg.  Step up with the crutches and injured leg.  Continue in this way. If there is a handrail:  Hold both crutches in one hand.  Place your free hand on the handrail.  While putting your weight on your arms, lift your healthy leg to the step.  Bring the crutches and the injured leg up to that step.  Continue in this way. Going Down Steps Be very careful, as going down stairs with crutches is very challenging. If there is no handrail:  Step down with the injured leg and crutches.  Step down with the healthy leg. If there is a handrail:  Place your hand on the handrail.  Hold both crutches with your free hand.  Lower your injured leg and crutch to the step below you. Make sure to keep the crutch tips in the center of the step, never on the edge.  Lower your healthy leg to that step.  Continue in this way. Standing Up 1. Hold the injured leg forward. 2. Grab  the armrest with one hand and the top of the crutches with the other hand. 3. Using these supports, pull yourself up to a standing position. Sitting Down 1. Hold the injured leg forward. 2. Grab the armrest with one hand and the top of the crutches with the other hand. 3. Lower yourself to a sitting position. SEEK MEDICAL CARE IF:  You still feel unsteady on your feet.  You develop new pain, for example in your armpits, back, shoulder, wrist, or hip.  You develop any numbness or tingling. SEEK IMMEDIATE MEDICAL CARE IF: You fall. Document Released: 02/05/2000 Document Revised: 02/12/2013 Document Reviewed: 10/15/2012 [facility] Patient Information 2015 [place], [place]. This information is not intended to replace advice given to you by your health care provider. Make sure you discuss any questions you have with your health care provider.

## 2014-02-03 ENCOUNTER — Ambulatory Visit: Payer: BC Managed Care – PPO | Admitting: Family Medicine

## 2014-04-30 ENCOUNTER — Other Ambulatory Visit (HOSPITAL_COMMUNITY)
Admission: RE | Admit: 2014-04-30 | Discharge: 2014-04-30 | Disposition: A | Discharge status: Home / Self Care | Payer: BLUE CROSS/BLUE SHIELD | Source: Ambulatory Visit | Attending: Gynecology | Admitting: Gynecology

## 2014-04-30 ENCOUNTER — Ambulatory Visit (INDEPENDENT_AMBULATORY_CARE_PROVIDER_SITE_OTHER): Payer: BLUE CROSS/BLUE SHIELD | Admitting: Women's Health

## 2014-04-30 ENCOUNTER — Encounter: Payer: Self-pay | Admitting: Women's Health

## 2014-04-30 VITALS — BP 132/80 | Ht 66.0 in | Wt 199.0 lb

## 2014-04-30 DIAGNOSIS — N760 Acute vaginitis: Secondary | ICD-10-CM

## 2014-04-30 DIAGNOSIS — Z01419 Encounter for gynecological examination (general) (routine) without abnormal findings: Secondary | ICD-10-CM

## 2014-04-30 DIAGNOSIS — Z30018 Encounter for initial prescription of other contraceptives: Secondary | ICD-10-CM | POA: Diagnosis not present

## 2014-04-30 DIAGNOSIS — A499 Bacterial infection, unspecified: Secondary | ICD-10-CM

## 2014-04-30 DIAGNOSIS — Z833 Family history of diabetes mellitus: Secondary | ICD-10-CM

## 2014-04-30 DIAGNOSIS — Z113 Encounter for screening for infections with a predominantly sexual mode of transmission: Secondary | ICD-10-CM

## 2014-04-30 DIAGNOSIS — B9689 Other specified bacterial agents as the cause of diseases classified elsewhere: Secondary | ICD-10-CM

## 2014-04-30 LAB — CBC WITH DIFFERENTIAL/PLATELET
Basophils Absolute: 0.1 10*3/uL (ref 0.0–0.1)
Basophils Relative: 1 % (ref 0–1)
EOS ABS: 0.3 10*3/uL (ref 0.0–0.7)
Eosinophils Relative: 4 % (ref 0–5)
HCT: 37.7 % (ref 36.0–46.0)
HEMOGLOBIN: 12.5 g/dL (ref 12.0–15.0)
Lymphocytes Relative: 36 % (ref 12–46)
Lymphs Abs: 2.3 10*3/uL (ref 0.7–4.0)
MCH: 29.9 pg (ref 26.0–34.0)
MCHC: 33.2 g/dL (ref 30.0–36.0)
MCV: 90.2 fL (ref 78.0–100.0)
MONO ABS: 0.5 10*3/uL (ref 0.1–1.0)
MONOS PCT: 8 % (ref 3–12)
MPV: 10.2 fL (ref 8.6–12.4)
Neutro Abs: 3.3 10*3/uL (ref 1.7–7.7)
Neutrophils Relative %: 51 % (ref 43–77)
PLATELETS: 272 10*3/uL (ref 150–400)
RBC: 4.18 MIL/uL (ref 3.87–5.11)
RDW: 13.2 % (ref 11.5–15.5)
WBC: 6.5 10*3/uL (ref 4.0–10.5)

## 2014-04-30 LAB — WET PREP FOR TRICH, YEAST, CLUE
TRICH WET PREP: NONE SEEN
WBC, Wet Prep HPF POC: NONE SEEN
YEAST WET PREP: NONE SEEN

## 2014-04-30 LAB — GLUCOSE, RANDOM: Glucose, Bld: 83 mg/dL (ref 70–99)

## 2014-04-30 MED ORDER — METRONIDAZOLE 500 MG PO TABS: 500.0000 mg | ORAL_TABLET | Freq: Two times a day (BID) | ORAL | Status: DC

## 2014-04-30 MED ORDER — ETONOGESTREL-ETHINYL ESTRADIOL 0.12-0.015 MG/24HR VA RING: VAGINAL_RING | VAGINAL | Status: AC

## 2014-04-30 NOTE — Progress Notes (Signed)
Darrold SpanMalaunia B Williams 1996/01/20 [id number]    History:    Presents for annual exam.  Regular monthly cycle condoms, used Depo-Provera in the past. Gardasil series completed. Has had problems with recurrent bacteria vaginosis. 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  Past medical history, past surgical history, family history and social history were all reviewed and documented in the EPIC chart. [place] [facility] [place] [facility] [place] Mother diabetes.  ROS:  A ROS was performed and pertinent positives and negatives are included.  Exam:  Filed Vitals:   04/30/14 1553  BP: 132/80    General appearance:  Normal Thyroid:  Symmetrical, normal in size, without palpable masses or nodularity. Respiratory  Auscultation:  Clear without wheezing or rhonchi Cardiovascular  Auscultation:  Regular rate, without rubs, murmurs or gallops  Edema/varicosities:  Not grossly evident Abdominal  Soft,nontender, without masses, guarding or rebound.  Liver/spleen:  No organomegaly noted  Hernia:  None appreciated  Skin  Inspection:  Grossly normal   Breasts: Examined lying and sitting/bilateral piercing.     Right: Without masses, retractions, discharge or axillary adenopathy.     Left: Without masses, retractions, discharge or axillary adenopathy. Gentitourinary   Inguinal/mons:  Normal without inguinal adenopathy  External genitalia:  Normal clitoris piercing  BUS/Urethra/Skene's glands:  Normal  Vagina:  Scant discharge, wet prep positive for amines, clues, TNTC bacteria  Cervix:  Normal  Uterus:  normal in size, shape and contour.  Midline and mobile  Adnexa/parametria:     Rt: Without masses or tenderness.   Lt: Without masses or tenderness.  Anus and perineum: Normal    Assessment/Plan:  19 y.o. SBF G0 for annual exam with complaint of recurrent bacteria vaginosis.  Bacteria vaginosis Monthly cycle/Contraception management STD screen  Plan: Flagyl 500 twice daily for 7  days, alcohol precautions reviewed. Options reviewed, will try boric acid 600 mg TWICE weekly per vagina after symptoms resolve. Instructed to call if no relief of symptoms. Contraception options reviewed, NuvaRing prescription, proper use given and reviewed slight risk for blood clots and strokes. Cycle just ended will start today, condoms especially first month and  for infection control. SBE's, regular exercise, calcium rich diet, continue Weight Watchers for continued weight loss. CBC, glucose, UA, Pap, new screening guidelines reviewed. GC/Chlamydia, HIV, hep B, C, RPRHarrington Challenger.  Royal Vandevoort [facility] LLCWHNP, 4:37 PM 04/30/2014

## 2014-04-30 NOTE — Patient Instructions (Signed)

## 2014-05-01 LAB — URINALYSIS W MICROSCOPIC + REFLEX CULTURE
Bacteria, UA: NONE SEEN
Bilirubin Urine: NEGATIVE
Casts: NONE SEEN
Crystals: NONE SEEN
Glucose, UA: NEGATIVE mg/dL
Ketones, ur: NEGATIVE mg/dL
Leukocytes, UA: NEGATIVE
Nitrite: NEGATIVE
PH: 5.5 (ref 5.0–8.0)
Protein, ur: NEGATIVE mg/dL
SQUAMOUS EPITHELIAL / LPF: NONE SEEN
Specific Gravity, Urine: 1.012 (ref 1.005–1.030)
Urobilinogen, UA: 0.2 mg/dL (ref 0.0–1.0)

## 2014-05-01 LAB — HIV ANTIBODY (ROUTINE TESTING W REFLEX): HIV 1&2 Ab, 4th Generation: NONREACTIVE

## 2014-05-01 LAB — GC/CHLAMYDIA PROBE AMP
CT Probe RNA: NEGATIVE
GC PROBE AMP APTIMA: NEGATIVE

## 2014-05-01 LAB — HEPATITIS C ANTIBODY: HCV AB: NEGATIVE

## 2014-05-01 LAB — RPR

## 2014-05-01 LAB — HEPATITIS B SURFACE ANTIGEN: Hepatitis B Surface Ag: NEGATIVE

## 2014-05-02 LAB — CYTOLOGY - PAP

## 2014-05-10 ENCOUNTER — Emergency Department (HOSPITAL_COMMUNITY): Payer: BLUE CROSS/BLUE SHIELD

## 2014-05-10 ENCOUNTER — Emergency Department (HOSPITAL_COMMUNITY)
Admission: EM | Admit: 2014-05-10 | Discharge: 2014-05-10 | Disposition: A | Discharge status: Home / Self Care | Payer: BLUE CROSS/BLUE SHIELD | Attending: Emergency Medicine | Admitting: Emergency Medicine

## 2014-05-10 ENCOUNTER — Encounter (HOSPITAL_COMMUNITY): Payer: Self-pay | Admitting: *Deleted

## 2014-05-10 DIAGNOSIS — K219 Gastro-esophageal reflux disease without esophagitis: Secondary | ICD-10-CM | POA: Insufficient documentation

## 2014-05-10 DIAGNOSIS — W102XXA Fall (on)(from) incline, initial encounter: Secondary | ICD-10-CM | POA: Insufficient documentation

## 2014-05-10 DIAGNOSIS — Y9289 Other specified places as the place of occurrence of the external cause: Secondary | ICD-10-CM | POA: Diagnosis not present

## 2014-05-10 DIAGNOSIS — Z79899 Other long term (current) drug therapy: Secondary | ICD-10-CM | POA: Insufficient documentation

## 2014-05-10 DIAGNOSIS — S93402A Sprain of unspecified ligament of left ankle, initial encounter: Secondary | ICD-10-CM | POA: Insufficient documentation

## 2014-05-10 DIAGNOSIS — Z8669 Personal history of other diseases of the nervous system and sense organs: Secondary | ICD-10-CM | POA: Insufficient documentation

## 2014-05-10 DIAGNOSIS — S99912A Unspecified injury of left ankle, initial encounter: Secondary | ICD-10-CM | POA: Diagnosis present

## 2014-05-10 DIAGNOSIS — Y9389 Activity, other specified: Secondary | ICD-10-CM | POA: Diagnosis not present

## 2014-05-10 DIAGNOSIS — J45909 Unspecified asthma, uncomplicated: Secondary | ICD-10-CM | POA: Insufficient documentation

## 2014-05-10 DIAGNOSIS — Z8679 Personal history of other diseases of the circulatory system: Secondary | ICD-10-CM | POA: Diagnosis not present

## 2014-05-10 DIAGNOSIS — Y998 Other external cause status: Secondary | ICD-10-CM | POA: Insufficient documentation

## 2014-05-10 MED ORDER — HYDROCODONE-ACETAMINOPHEN 5-325 MG PO TABS: 1.0000 | ORAL_TABLET | ORAL | Status: DC | PRN

## 2014-05-10 MED ORDER — OXYCODONE-ACETAMINOPHEN 5-325 MG PO TABS
1.0000 | ORAL_TABLET | Freq: Once | ORAL | Status: AC
  Administered 2014-05-10: 1 via ORAL
  Filled 2014-05-10 (×2): qty 1

## 2014-05-10 MED ORDER — IBUPROFEN 600 MG PO TABS: 600.0000 mg | ORAL_TABLET | Freq: Four times a day (QID) | ORAL | Status: DC | PRN

## 2014-05-10 NOTE — ED Notes (Signed)
Bed: [id number] Expected date: 05/10/14 Expected time: 2:08 PM Means of arrival: Ambulance Comments: Ankle deformity

## 2014-05-10 NOTE — ED Provider Notes (Signed)
CSN: [id number]     Arrival date & time 05/10/14  1419 History   First MD Initiated Contact with Patient 05/10/14 1459     Chief Complaint  Patient presents with  . Ankle Pain     (Consider location/radiation/quality/duration/timing/severity/associated sxs/prior Treatment) The history is provided by the patient and medical records.    This is a 19 y.o. F who presents today with a chief complaint of left ankle pain. She was stepping off her front porch when she twisted her ankle and fell to her knees. She states that her ankle "rolled backwards" and she heard a loud pop. No head injury or LOC.  She has been unable to bear weight on the left ankle since fall. EMS stabilized her ankle and she received Percocet for pain in the ED. She denies numbness, paresthesias, or weakness of left leg or foot.  States she did have prior operative repair on the arch of her left foot when she was younger, no other surgeries.  VSS.  Past Medical History  Diagnosis Date  . ROP (retinopathy of prematurity)   . Hx of migraines   . Bronchiolitis   . Asthma   . GERD (gastroesophageal reflux disease)    Past Surgical History  Procedure Laterality Date  . Refractive surgery    . Wisdom tooth extraction    . Inguinal hernia pediatric with laparoscopic exam     Family History  Problem Relation Age of Onset  . Sickle cell trait Mother   . Hypertension Mother   . Diabetes Mother   . Deep vein thrombosis Mother   . Heart disease Other    History  Substance Use Topics  . Smoking status: Never Smoker   . Smokeless tobacco: Never Used  . Alcohol Use: No   OB History    Gravida Para Term Preterm AB TAB SAB Ectopic Multiple Living   0              Review of Systems  Musculoskeletal: Positive for joint swelling and arthralgias.  All other systems reviewed and are negative.     Allergies  Bee venom and Shellfish allergy  Home Medications   Prior to Admission medications   Medication Sig Start  Date End Date Taking? Authorizing Provider  albuterol (PROVENTIL HFA;VENTOLIN HFA) 108 (90 BASE) MCG/ACT inhaler Inhale 2 puffs into the lungs every 4 (four) hours as needed for wheezing or shortness of breath.   Yes Historical Provider, MD  BIOTIN PO Take 1 tablet by mouth daily.   Yes Historical Provider, MD  EPINEPHrine (EPIPEN) 0.3 mg/0.3 mL IJ SOAJ injection Inject 0.3 mg into the muscle once.   Yes Historical Provider, MD  etonogestrel-ethinyl estradiol (NUVARING) 0.12-0.015 MG/24HR vaginal ring Insert vaginally and leave in place for 3 consecutive weeks, then remove for 1 week. 04/30/14  Yes Harrington Challenger, NP  FOLIC ACID PO Take 1 tablet by mouth daily.   Yes Historical Provider, MD  Multiple Vitamin (MULTIVITAMIN WITH MINERALS) TABS tablet Take 1 tablet by mouth daily.   Yes Historical Provider, MD  omeprazole (PRILOSEC OTC) 20 MG tablet Take 20 mg by mouth daily as needed (GERD).    Yes Historical Provider, MD  metroNIDAZOLE (FLAGYL) 500 MG tablet Take 1 tablet (500 mg total) by mouth 2 (two) times daily. Patient not taking: Reported on 05/10/2014 04/30/14   Harrington Challenger, NP   BP 123/74 mmHg  Pulse 83  Temp(Src) 99.3 F (37.4 C) (Oral)  Resp 18  SpO2  100%  LMP 04/26/2014   Physical Exam  Constitutional: She is oriented to person, place, and time. She appears well-developed and well-nourished.  HENT:  Head: Normocephalic and atraumatic.  Mouth/Throat: Oropharynx is clear and moist.  Eyes: Conjunctivae and EOM are normal. Pupils are equal, round, and reactive to light.  Neck: Normal range of motion.  Cardiovascular: Normal rate, regular rhythm and normal heart sounds.   Pulmonary/Chest: Effort normal and breath sounds normal.  Abdominal: Soft. Bowel sounds are normal.  Musculoskeletal: She exhibits tenderness.       Left ankle: She exhibits decreased range of motion and swelling. She exhibits no ecchymosis. Tenderness. Lateral malleolus tenderness found.       Feet:  Left lateral  ankle with tenderness an swelling present; no gross bony deformities; decreased ROM due to pain; calf compartments remain soft; full ROM of left knee and all toes; strong DP pulse and cap refill; normal sensation  Neurological: She is alert and oriented to person, place, and time.  Skin: Skin is warm and dry.  Psychiatric: She has a normal mood and affect. Her behavior is normal.  Nursing note and vitals reviewed.   ED Course  Procedures (including critical care time) Labs Review Labs Reviewed - No data to display  Imaging Review Dg Ankle Complete Left  05/10/2014   CLINICAL DATA:  [age] year old female acute fall, twisted ankle left ankle injury, pain and swelling. Initial encounter.  EXAM: LEFT ANKLE COMPLETE - 3+ VIEW  COMPARISON:  01/31/2014 foot radiograph  FINDINGS: There is no evidence of fracture, dislocation, or joint effusion. There is no evidence of arthropathy or other focal bone abnormality. Soft tissues are unremarkable.  IMPRESSION: Negative.   Electronically Signed   By: Harmon PierJeffrey  Hu M.D.   On: 05/10/2014 15:53     EKG Interpretation None      MDM   Final diagnoses:  Fall (on)(from) incline, initial encounter  Left ankle sprain, initial encounter   19 y.o. F with fall from porch twisting her left ankle.  No head injury or LOC.  Patient with swelling around lateral malleolus, no gross deformities.  Leg and foot remains NVI.  X-ray negative for acute/fx dislocation.  Patient placed in ASO splint, she has crutches at home to use.  Rx motrin and vicodin.  Referral to orthopedics given for any worsening symptoms or no improvement within 1 week.  Discussed plan with patient, he/she acknowledged understanding and agreed with plan of care.  Return precautions given for new or worsening symptoms.  Garlon HatchetLisa M Kupono Marling, PA-C 05/10/14 1651  Cathren LaineKevin Steinl, MD 05/11/14 [phone]

## 2014-05-10 NOTE — ED Notes (Signed)
Pt from home via EMS-Per EMS pt was leaving for work and fell in her yard. Pt sts that she heard a pop in her L ankle. Pt has obvious swelling and possible deformity. Pt has good pulses and circulation. Pt did not hit head. Pt had no LOC. Pt is A&O and in NAD

## 2014-05-10 NOTE — Discharge Instructions (Signed)
Take the prescribed medication as directed for pain control.  May wish to ice and elevate ankle at home to help with pain/swelling as well. Start non-weight bearing on crutches and progress back to full weight bearing as tolerated. Follow-up with orthopedics, Dr. Charlann Boxerlin if symptoms worsening or no improvement in 1 week. Return to the ED for new concerns.

## 2014-05-10 NOTE — ED Notes (Signed)
Ortho Tech called for ASO placement

## 2014-05-15 ENCOUNTER — Encounter (HOSPITAL_BASED_OUTPATIENT_CLINIC_OR_DEPARTMENT_OTHER): Payer: Self-pay | Admitting: *Deleted

## 2014-05-15 ENCOUNTER — Emergency Department (HOSPITAL_BASED_OUTPATIENT_CLINIC_OR_DEPARTMENT_OTHER)
Admission: EM | Admit: 2014-05-15 | Discharge: 2014-05-15 | Disposition: A | Discharge status: Home / Self Care | Payer: BLUE CROSS/BLUE SHIELD | Attending: Emergency Medicine | Admitting: Emergency Medicine

## 2014-05-15 ENCOUNTER — Emergency Department (HOSPITAL_BASED_OUTPATIENT_CLINIC_OR_DEPARTMENT_OTHER): Payer: BLUE CROSS/BLUE SHIELD

## 2014-05-15 DIAGNOSIS — S93402A Sprain of unspecified ligament of left ankle, initial encounter: Secondary | ICD-10-CM | POA: Diagnosis not present

## 2014-05-15 DIAGNOSIS — K219 Gastro-esophageal reflux disease without esophagitis: Secondary | ICD-10-CM | POA: Diagnosis not present

## 2014-05-15 DIAGNOSIS — Y9389 Activity, other specified: Secondary | ICD-10-CM | POA: Insufficient documentation

## 2014-05-15 DIAGNOSIS — S93402D Sprain of unspecified ligament of left ankle, subsequent encounter: Secondary | ICD-10-CM

## 2014-05-15 DIAGNOSIS — Z8669 Personal history of other diseases of the nervous system and sense organs: Secondary | ICD-10-CM | POA: Diagnosis not present

## 2014-05-15 DIAGNOSIS — Y998 Other external cause status: Secondary | ICD-10-CM | POA: Insufficient documentation

## 2014-05-15 DIAGNOSIS — W08XXXA Fall from other furniture, initial encounter: Secondary | ICD-10-CM | POA: Diagnosis not present

## 2014-05-15 DIAGNOSIS — Y9289 Other specified places as the place of occurrence of the external cause: Secondary | ICD-10-CM | POA: Insufficient documentation

## 2014-05-15 DIAGNOSIS — S99912A Unspecified injury of left ankle, initial encounter: Secondary | ICD-10-CM | POA: Diagnosis present

## 2014-05-15 DIAGNOSIS — J45909 Unspecified asthma, uncomplicated: Secondary | ICD-10-CM | POA: Diagnosis not present

## 2014-05-15 NOTE — ED Notes (Signed)
Slipped and fell off porch. Injury to her left ankle. [facility]. She is having pain in her lower leg.

## 2014-05-15 NOTE — Discharge Instructions (Signed)

## 2014-05-15 NOTE — ED Provider Notes (Signed)
CSN: [id number]     Arrival date & time 05/15/14  1758 History  This chart was scribed for Gilda Creasehristopher J Taysean Wager, MD by Roxy Cedarhandni Bhalodia, ED Scribe. This patient was seen in room MHFT1/MHFT1 and the patient's care was started at 6:38 PM.   Chief Complaint  Patient presents with  . Ankle Injury   Patient is a 19 y.o. female presenting with lower extremity injury. The history is provided by the patient. No language interpreter was used.  Ankle Injury This is a recurrent problem. The current episode started more than 2 days ago. The problem has not changed since onset.Pertinent negatives include no chest pain, no abdominal pain, no headaches and no shortness of breath. Nothing aggravates the symptoms. Nothing relieves the symptoms.   HPI Comments: Charlene Crosby is a 19 y.o. female with a PMHx of migraines, bronchiolitis, asthma, GERD and retinopathy of prematurity, who presents to the Emergency Department complaining of moderate left ankle pain that began 6 days ago after patient slipped and fell off of her porch. [facility] negative x[facility]. Patient complains of associated pain to left lower leg today. Mother complains that patient had an xray done of her ankle and not her entire left leg. Patient states that she has pain in her left ankle with associated pain shooting up the lateral side of her left lower leg.  Past Medical History  Diagnosis Date  . ROP (retinopathy of prematurity)   . Hx of migraines   . Bronchiolitis   . Asthma   . GERD (gastroesophageal reflux disease)    Past Surgical History  Procedure Laterality Date  . Refractive surgery    . Wisdom tooth extraction    . Inguinal hernia pediatric with laparoscopic exam     Family History  Problem Relation Age of Onset  . Sickle cell trait Mother   . Hypertension Mother   . Diabetes Mother   . Deep vein thrombosis Mother   . Heart disease Other    History  Substance Use Topics  .  Smoking status: Never Smoker   . Smokeless tobacco: Never Used  . Alcohol Use: No   OB History    Gravida Para Term Preterm AB TAB SAB Ectopic Multiple Living   0              Review of Systems  Respiratory: Negative for shortness of breath.   Cardiovascular: Negative for chest pain.  Gastrointestinal: Negative for abdominal pain.  Musculoskeletal: Positive for myalgias, joint swelling and arthralgias.  Neurological: Negative for headaches.  All other systems reviewed and are negative.  Allergies  Bee venom and Shellfish allergy  Home Medications   Prior to Admission medications   Medication Sig Start Date End Date Taking? Authorizing Provider  albuterol (PROVENTIL HFA;VENTOLIN HFA) 108 (90 BASE) MCG/ACT inhaler Inhale 2 puffs into the lungs every 4 (four) hours as needed for wheezing or shortness of breath.    Historical Provider, MD  BIOTIN PO Take 1 tablet by mouth daily.    Historical Provider, MD  EPINEPHrine (EPIPEN) 0.3 mg/0.3 mL IJ SOAJ injection Inject 0.3 mg into the muscle once.    Historical Provider, MD  etonogestrel-ethinyl estradiol (NUVARING) 0.12-0.015 MG/24HR vaginal ring Insert vaginally and leave in place for 3 consecutive weeks, then remove for 1 week. 04/30/14   Harrington ChallengerNancy J Young, NP  FOLIC ACID PO Take 1 tablet by mouth daily.    Historical Provider, MD  HYDROcodone-acetaminophen (  NORCO/VICODIN) 5-325 MG per tablet Take 1 tablet by mouth every 4 (four) hours as needed. 05/10/14   Garlon Hatchet, PA-C  ibuprofen (ADVIL,MOTRIN) 600 MG tablet Take 1 tablet (600 mg total) by mouth every 6 (six) hours as needed. 05/10/14   Garlon Hatchet, PA-C  metroNIDAZOLE (FLAGYL) 500 MG tablet Take 1 tablet (500 mg total) by mouth 2 (two) times daily. Patient not taking: Reported on 05/10/2014 04/30/14   Harrington Challenger, NP  Multiple Vitamin (MULTIVITAMIN WITH MINERALS) TABS tablet Take 1 tablet by mouth daily.    Historical Provider, MD  omeprazole (PRILOSEC OTC) 20 MG tablet Take 20 mg by  mouth daily as needed (GERD).     Historical Provider, MD   Triage Vitals: BP 138/84 mmHg  Pulse 89  Temp(Src) 98.7 F (37.1 C) (Oral)  Resp 20  Ht  (1.676 m)  Wt 199 lb (90.266 kg)  BMI 32.13 kg/m2  SpO2 100%  LMP 04/26/2014  Physical Exam  Constitutional: She is oriented to person, place, and time. She appears well-developed and well-nourished. No distress.  HENT:  Head: Normocephalic and atraumatic.  Right Ear: Hearing normal.  Left Ear: Hearing normal.  Nose: Nose normal.  Mouth/Throat: Oropharynx is clear and moist and mucous membranes are normal.  Eyes: Conjunctivae and EOM are normal. Pupils are equal, round, and reactive to light.  Neck: Normal range of motion. Neck supple.  Cardiovascular: Regular rhythm, S1 normal and S2 normal.  Exam reveals no gallop and no friction rub.   No murmur heard. Pulmonary/Chest: Effort normal and breath sounds normal. No respiratory distress. She exhibits no tenderness.  Abdominal: Soft. Normal appearance and bowel sounds are normal. There is no hepatosplenomegaly. There is no tenderness. There is no rebound, no guarding, no tenderness at McBurney's point and negative Murphy's sign. No hernia.  Musculoskeletal: Normal range of motion. She exhibits tenderness.       Left ankle: Tenderness. AITFL tenderness found.  Neurological: She is alert and oriented to person, place, and time. She has normal strength. No cranial nerve deficit or sensory deficit. Coordination normal. GCS eye subscore is 4. GCS verbal subscore is 5. GCS motor subscore is 6.  Skin: Skin is warm, dry and intact. No rash noted. No cyanosis.  Psychiatric: She has a normal mood and affect. Her speech is normal and behavior is normal. Thought content normal.  Nursing note and vitals reviewed.  ED Course  Procedures (including critical care time)  DIAGNOSTIC STUDIES: Oxygen Saturation is 100% on RA, normal by my interpretation.    COORDINATION OF CARE: 6:41 PM- Discussed  plans to order diagnostic imaging of left tibia/fibula. Discussed negative xray with patient. Will give her medication for pain management. Will refer patient to orthopedist. Pt advised of plan for treatment and pt agrees.  Labs Review Labs Reviewed - No data to display  Imaging Review Dg Tibia/fibula Left  05/15/2014   CLINICAL DATA:  Post fall from front porch on Saturday with persistent lateral sided ankle pain radiating up the leg.  EXAM: LEFT TIBIA AND FIBULA - 2 VIEW  COMPARISON:  Left ankle radiographs - 05/10/2014  FINDINGS: No fracture or dislocation. A suspected bone island is incidentally noted within the mid/inferior tibial diaphysis. No associated periostitis. Limited visualization of the adjacent knee and ankle is normal given obliquity. Regional soft tissues appear normal. No radiopaque foreign body.  IMPRESSION: No explanation for patient's persistent lower extremity pain.   Electronically Signed   By: Simonne Come  M.D.   On: 05/15/2014 18:40     EKG Interpretation None     MDM   Final diagnoses:  Ankle sprain, left, subsequent encounter    Patient with persistent pain in the ankle. Repeat x-ray did not show any acute abnormality. Patient does have tenderness, swelling in the area of the lateral ligaments. Will refer to orthopedics. Cam Walker for comfort.  I personally performed the services described in this documentation, which was scribed in my presence. The recorded information has been reviewed and is accurate.    Gilda Crease, MD 05/15/14 [phone]

## 2014-05-20 ENCOUNTER — Ambulatory Visit: Payer: BLUE CROSS/BLUE SHIELD | Admitting: Women's Health

## 2014-05-23 ENCOUNTER — Ambulatory Visit: Payer: BLUE CROSS/BLUE SHIELD | Admitting: Women's Health

## 2014-05-27 ENCOUNTER — Encounter: Payer: Self-pay | Admitting: Women's Health

## 2014-05-27 ENCOUNTER — Ambulatory Visit (INDEPENDENT_AMBULATORY_CARE_PROVIDER_SITE_OTHER): Payer: BLUE CROSS/BLUE SHIELD | Admitting: Women's Health

## 2014-05-27 VITALS — BP 122/80 | Ht 66.0 in | Wt 195.0 lb

## 2014-05-27 DIAGNOSIS — A499 Bacterial infection, unspecified: Secondary | ICD-10-CM | POA: Diagnosis not present

## 2014-05-27 DIAGNOSIS — N760 Acute vaginitis: Secondary | ICD-10-CM

## 2014-05-27 DIAGNOSIS — R350 Frequency of micturition: Secondary | ICD-10-CM

## 2014-05-27 DIAGNOSIS — B9689 Other specified bacterial agents as the cause of diseases classified elsewhere: Secondary | ICD-10-CM

## 2014-05-27 DIAGNOSIS — N946 Dysmenorrhea, unspecified: Secondary | ICD-10-CM | POA: Diagnosis not present

## 2014-05-27 LAB — WET PREP FOR TRICH, YEAST, CLUE
CLUE CELLS WET PREP: NONE SEEN
Trich, Wet Prep: NONE SEEN
YEAST WET PREP: NONE SEEN

## 2014-05-27 LAB — URINALYSIS W MICROSCOPIC + REFLEX CULTURE
BILIRUBIN URINE: NEGATIVE
Casts: NONE SEEN
Crystals: NONE SEEN
GLUCOSE, UA: NEGATIVE mg/dL
KETONES UR: NEGATIVE mg/dL
Nitrite: NEGATIVE
PROTEIN: NEGATIVE mg/dL
Specific Gravity, Urine: 1.015 (ref 1.005–1.030)
UROBILINOGEN UA: 0.2 mg/dL (ref 0.0–1.0)
pH: 5.5 (ref 5.0–8.0)

## 2014-05-27 MED ORDER — IBUPROFEN 600 MG PO TABS: 600.0000 mg | ORAL_TABLET | Freq: Four times a day (QID) | ORAL | Status: DC | PRN

## 2014-05-27 MED ORDER — METRONIDAZOLE 500 MG PO TABS: 500.0000 mg | ORAL_TABLET | Freq: Two times a day (BID) | ORAL | Status: DC

## 2014-05-27 NOTE — Addendum Note (Signed)
Addended by: Aura CampsWEBB, JENNIFER L on: 05/27/2014 02:49 PM   Modules accepted: Orders

## 2014-05-27 NOTE — Progress Notes (Signed)
Patient ID: Charlene Crosby, female   DOB: [birth year], 19 y.o.   MRN: [id number] Presents with complaint of increased urinary frequency and urgency, denies pain, burning, back pain or fever. Reports continues with symptoms of bacteria vaginosis of vaginal odor, minimal discharge, denies itching. Not sexually active, NuvaRing, end of cycle. Prescribed boric acid gelcaps, reports too expensive did not fill.  Exam: Appears well. External genitalia within normal limits, speculum exam scant menses type blood no discharge, odor, erythema noted. Wet prep negative. UA: Small blood, trace leukocytes, 3-6 WBCs, 3-6 RBCs, few bacteria.  History of recurrent BV   Plan: Urine culture pending. Reviewed normality of wet prep. Call if continued problems. Condoms encouraged if sexually active.

## 2014-05-28 LAB — URINE CULTURE: Colony Count: 40000

## 2014-07-08 ENCOUNTER — Ambulatory Visit (INDEPENDENT_AMBULATORY_CARE_PROVIDER_SITE_OTHER): Payer: BLUE CROSS/BLUE SHIELD | Admitting: Gynecology

## 2014-07-08 ENCOUNTER — Encounter: Payer: Self-pay | Admitting: Gynecology

## 2014-07-08 VITALS — BP 130/80

## 2014-07-08 DIAGNOSIS — A499 Bacterial infection, unspecified: Secondary | ICD-10-CM

## 2014-07-08 DIAGNOSIS — N760 Acute vaginitis: Secondary | ICD-10-CM | POA: Diagnosis not present

## 2014-07-08 DIAGNOSIS — Z113 Encounter for screening for infections with a predominantly sexual mode of transmission: Secondary | ICD-10-CM

## 2014-07-08 DIAGNOSIS — B9689 Other specified bacterial agents as the cause of diseases classified elsewhere: Secondary | ICD-10-CM

## 2014-07-08 DIAGNOSIS — N898 Other specified noninflammatory disorders of vagina: Secondary | ICD-10-CM

## 2014-07-08 LAB — WET PREP FOR TRICH, YEAST, CLUE
Trich, Wet Prep: NONE SEEN
WBC WET PREP: NONE SEEN
YEAST WET PREP: NONE SEEN

## 2014-07-08 MED ORDER — TINIDAZOLE 500 MG PO TABS: 500.0000 mg | ORAL_TABLET | Freq: Once | ORAL | Status: DC

## 2014-07-08 NOTE — Progress Notes (Signed)
   Patient is a 19 year old the presented to the office today complaining of vaginal discharge. She states it is kind of brownish with a fishy odor and some external pruritus. She has a new sexual partner approximate 2 weeks ago. She reports having recurrent BV. She states she had it in 2013 as well as twice in 2014 and earlier this year. She did have a full STD panel a few months ago. She is using the NuvaRing and condoms for contraception.  Exam: Bartholin urethra Skene was within normal limits Vagina: Brown fishy odor discharge noted Cervix: No lesions or discharge  Vagina Bimanual exam: Not done Rectal vaginal exam not done  Wet prep: Positive amine, moderate clue cells, many bacteria  Assessment/plan: Bacterial vaginosis will be treated with Tindamax 500 mg. Patient to take 4 tablets today repeat in 24 hours. She will begin using refresh a probiotic tablet to take daily to prevent recurrence. GC and Chlamydia culture pending at time of this dictation.

## 2014-07-08 NOTE — Addendum Note (Signed)
Addended by: Berna SpareASTILLO, Naitik Hermann A on: 07/08/2014 04:56 PM   Modules accepted: Orders

## 2014-07-08 NOTE — Patient Instructions (Signed)
Purchase in your family Rephresh Probiotic Tablet and take one every day to prevent recurrence of BV. No prescription needed  Bacterial Vaginosis Bacterial vaginosis is a vaginal infection that occurs when the normal balance of bacteria in the vagina is disrupted. It results from an overgrowth of certain bacteria. This is the most common vaginal infection in women of childbearing age. Treatment is important to prevent complications, especially in pregnant women, as it can cause a premature delivery. CAUSES  Bacterial vaginosis is caused by an increase in harmful bacteria that are normally present in smaller amounts in the vagina. Several different kinds of bacteria can cause bacterial vaginosis. However, the reason that the condition develops is not fully understood. RISK FACTORS Certain activities or behaviors can put you at an increased risk of developing bacterial vaginosis, including:  Having a new sex partner or multiple sex partners.  Douching.  Using an intrauterine device (IUD) for contraception. Women do not get bacterial vaginosis from toilet seats, bedding, swimming pools, or contact with objects around them. SIGNS AND SYMPTOMS  Some women with bacterial vaginosis have no signs or symptoms. Common symptoms include:  Grey vaginal discharge.  A fishlike odor with discharge, especially after sexual intercourse.  Itching or burning of the vagina and vulva.  Burning or pain with urination. DIAGNOSIS  Your health care provider will take a medical history and examine the vagina for signs of bacterial vaginosis. A sample of vaginal fluid may be taken. Your health care provider will look at this sample under a microscope to check for bacteria and abnormal cells. A vaginal pH test may also be done.  TREATMENT  Bacterial vaginosis may be treated with antibiotic medicines. These may be given in the form of a pill or a vaginal cream. A second round of antibiotics may be prescribed if the  condition comes back after treatment.  HOME CARE INSTRUCTIONS   Only take over-the-counter or prescription medicines as directed by your health care provider.  If antibiotic medicine was prescribed, take it as directed. Make sure you finish it even if you start to feel better.  Do not have sex until treatment is completed.  Tell all sexual partners that you have a vaginal infection. They should see their health care provider and be treated if they have problems, such as a mild rash or itching.  Practice safe sex by using condoms and only having one sex partner. SEEK MEDICAL CARE IF:   Your symptoms are not improving after 3 days of treatment.  You have increased discharge or pain.  You have a fever. MAKE SURE YOU:   Understand these instructions.  Will watch your condition.  Will get help right away if you are not doing well or get worse. FOR MORE INFORMATION  Centers for Disease Control and Prevention, Division of STD Prevention: [employer]Apps.co.za American Sexual Health Association (ASHA): www.ashastd.org  Document Released: 02/07/2005 Document Revised: 11/28/2012 Document Reviewed: 09/19/2012 [facility] Patient Information 2015 [place], [place]. This information is not intended to replace advice given to you by your health care provider. Make sure you discuss any questions you have with your health care provider.  Tinidazole tablets What is this medicine? TINIDAZOLE (tye NI da zole) is an antiinfective. It is used to treat amebiasis, giardiasis, trichomoniasis, and vaginosis. It will not work for colds, flu, or other viral infections. This medicine may be used for other purposes; ask your health care provider or pharmacist if you have questions. COMMON BRAND NAME(S): Tindamax What should I tell my  health care provider before I take this medicine? They need to know if you have any of these conditions: -anemia or other blood disorders -if you frequently drink alcohol containing  drinks -receiving hemodialysis -seizure disorder -an unusual or allergic reaction to tinidazole, other medicines, foods, dyes, or preservatives -pregnant or trying to get pregnant -breast-feeding How should I use this medicine? Take this medicine by mouth with a full glass of water. Follow the directions on the prescription label. Take with food. Take your medicine at regular intervals. Do not take your medicine more often than directed. Take all of your medicine as directed even if you think you are better. Do not skip doses or stop your medicine early. Talk to your pediatrician regarding the use of this medicine in children. While this drug may be prescribed for children as young as 273 years of age for selected conditions, precautions do apply. Overdosage: If you think you have taken too much of this medicine contact a poison control center or emergency room at once. NOTE: This medicine is only for you. Do not share this medicine with others. What if I miss a dose? If you miss a dose, take it as soon as you can. If it is almost time for your next dose, take only that dose. Do not take double or extra doses. What may interact with this medicine? Do not take this medicine with any of the following medications: -alcohol or any product that contains alcohol -amprenavir oral solution -disulfiram -paclitaxel injection -ritonavir oral solution -sertraline oral solution -sulfamethoxazole-trimethoprim injection This medicine may also interact with the following medications: -cholestyramine -cimetidine -conivaptan -cyclosporin -fluorouracil -fosphenytoin, phenytoin -ketoconazole -lithium -phenobarbital -tacrolimus -warfarin This list may not describe all possible interactions. Give your health care provider a list of all the medicines, herbs, non-prescription drugs, or dietary supplements you use. Also tell them if you smoke, drink alcohol, or use illegal drugs. Some items may interact with  your medicine. What should I watch for while using this medicine? Tell your doctor or health care professional if your symptoms do not improve or if they get worse. Avoid alcoholic drinks while you are taking this medicine and for three days afterward. Alcohol may make you feel dizzy, sick, or flushed. If you are being treated for a sexually transmitted disease, avoid sexual contact until you have finished your treatment. Your sexual partner may also need treatment. What side effects may I notice from receiving this medicine? Side effects that you should report to your doctor or health care professional as soon as possible: -allergic reactions like skin rash, itching or hives, swelling of the face, lips, or tongue -breathing problems -confusion, depression -dark or white patches in the mouth -feeling faint or lightheaded, falls -fever, infection -numbness, tingling, pain or weakness in the hands or feet -pain when passing urine -seizures -unusually weak or tired -vaginal irritation or discharge -vomiting Side effects that usually do not require medical attention (report to your doctor or health care professional if they continue or are bothersome): -dark brown or reddish urine -diarrhea -headache -loss of appetite -metallic taste -nausea -stomach upset This list may not describe all possible side effects. Call your doctor for medical advice about side effects. You may report side effects to FDA at 1-800-FDA-1088. Where should I keep my medicine? Keep out of the reach of children. Store at room temperature between 15 and 30 degrees C (59 and 86 degrees F). Protect from light and moisture. Keep container tightly closed. Throw away any unused  medicine after the expiration date. NOTE: This sheet is a summary. It may not cover all possible information. If you have questions about this medicine, talk to your doctor, pharmacist, or health care provider.  2015, Elsevier/Gold Standard.  (2007-11-05 15:22:28)

## 2014-07-09 LAB — GC/CHLAMYDIA PROBE AMP
CT Probe RNA: NEGATIVE
GC PROBE AMP APTIMA: NEGATIVE

## 2014-08-08 ENCOUNTER — Ambulatory Visit: Payer: BLUE CROSS/BLUE SHIELD | Admitting: Women's Health

## 2014-08-12 ENCOUNTER — Encounter: Payer: Self-pay | Admitting: Women's Health

## 2014-08-12 ENCOUNTER — Ambulatory Visit (INDEPENDENT_AMBULATORY_CARE_PROVIDER_SITE_OTHER): Payer: BLUE CROSS/BLUE SHIELD | Admitting: Women's Health

## 2014-08-12 VITALS — BP 136/80 | Ht 66.0 in | Wt 188.0 lb

## 2014-08-12 DIAGNOSIS — R350 Frequency of micturition: Secondary | ICD-10-CM | POA: Diagnosis not present

## 2014-08-12 DIAGNOSIS — B373 Candidiasis of vulva and vagina: Secondary | ICD-10-CM | POA: Diagnosis not present

## 2014-08-12 DIAGNOSIS — N898 Other specified noninflammatory disorders of vagina: Secondary | ICD-10-CM | POA: Diagnosis not present

## 2014-08-12 DIAGNOSIS — B3731 Acute candidiasis of vulva and vagina: Secondary | ICD-10-CM

## 2014-08-12 LAB — URINALYSIS W MICROSCOPIC + REFLEX CULTURE
BILIRUBIN URINE: NEGATIVE
CRYSTALS: NONE SEEN
Casts: NONE SEEN
GLUCOSE, UA: NEGATIVE mg/dL
KETONES UR: NEGATIVE mg/dL
Nitrite: NEGATIVE
Protein, ur: NEGATIVE mg/dL
SPECIFIC GRAVITY, URINE: 1.005 (ref 1.005–1.030)
UROBILINOGEN UA: 0.2 mg/dL (ref 0.0–1.0)
pH: 6 (ref 5.0–8.0)

## 2014-08-12 LAB — WET PREP FOR TRICH, YEAST, CLUE
CLUE CELLS WET PREP: NONE SEEN
TRICH WET PREP: NONE SEEN
Yeast Wet Prep HPF POC: NONE SEEN

## 2014-08-12 MED ORDER — FLUCONAZOLE 150 MG PO TABS: 150.0000 mg | ORAL_TABLET | Freq: Once | ORAL | Status: AC

## 2014-08-12 NOTE — Patient Instructions (Signed)

## 2014-08-12 NOTE — Progress Notes (Signed)
Patient ID: Charlene Crosby, female   DOB: 13-Jan-1996, 19 y.o.   MRN: [id number] Presents with complaint of increased vaginal discharge with odor, itching, irritation and urinary frequency for several days. History of recurrent BV.  Denies abdominal pain, fever. Same partner negative STD screen. Contraception on NuvaRing. Graduating from nursing school in 2 weeks.  Exam: Appears well. External genitalia within normal limits, speculum exam moderate amount of a white curdy-type discharge noted wet prep negative. Bimanual no CMT or adnexal tenderness or fullness. UA: Moderate leukocytes, 7-11 WBCs, few bacteria, 3-6 RBCs.  Clinical yeast  Plan: Urine culture pending. Diflucan 150 by mouth times one dose with refill. Yeast prevention discussed. Call if no relief of symptoms.

## 2014-08-13 LAB — URINE CULTURE
Colony Count: NO GROWTH
Organism ID, Bacteria: NO GROWTH

## 2014-08-14 ENCOUNTER — Ambulatory Visit: Payer: BLUE CROSS/BLUE SHIELD | Admitting: Women's Health

## 2014-10-06 ENCOUNTER — Encounter: Payer: Self-pay | Admitting: Gynecology

## 2014-10-06 ENCOUNTER — Ambulatory Visit (INDEPENDENT_AMBULATORY_CARE_PROVIDER_SITE_OTHER): Payer: BLUE CROSS/BLUE SHIELD | Admitting: Gynecology

## 2014-10-06 ENCOUNTER — Ambulatory Visit: Payer: BLUE CROSS/BLUE SHIELD | Admitting: Gynecology

## 2014-10-06 VITALS — BP 126/80

## 2014-10-06 DIAGNOSIS — N898 Other specified noninflammatory disorders of vagina: Secondary | ICD-10-CM | POA: Diagnosis not present

## 2014-10-06 DIAGNOSIS — N760 Acute vaginitis: Secondary | ICD-10-CM | POA: Diagnosis not present

## 2014-10-06 DIAGNOSIS — A499 Bacterial infection, unspecified: Secondary | ICD-10-CM

## 2014-10-06 DIAGNOSIS — Z113 Encounter for screening for infections with a predominantly sexual mode of transmission: Secondary | ICD-10-CM

## 2014-10-06 DIAGNOSIS — B9689 Other specified bacterial agents as the cause of diseases classified elsewhere: Secondary | ICD-10-CM

## 2014-10-06 LAB — WET PREP FOR TRICH, YEAST, CLUE
Trich, Wet Prep: NONE SEEN
Yeast Wet Prep HPF POC: NONE SEEN

## 2014-10-06 MED ORDER — TINIDAZOLE 500 MG PO TABS: ORAL_TABLET | ORAL | Status: AC

## 2014-10-06 NOTE — Progress Notes (Signed)
  P is a 19 year old who presented to the office today requesting an STD screen. She was having slight vaginal discharge. Review of her record indicated in March of this year she had an STD screening which was negative. She Skene married at the end of the month and moving to [place]. Her Pap smear this year was normal. She's using the NuvaRing for contraception.  Exam: Abdomen: Soft nontender no rebound or guarding Pelvic: Bartholin urethra Skene was within normal limits Vagina: No lesions or discharge Cervix: No lesions or discharge Bimanual exam: Not done Adnexa: No bimanual exam done Rectal exam: Not done  Wet prep few clue cells were noted along with few white blood cells and too numerous to count bacteria  GC and Chlamydia culture obtained pending at time of this dictation  Assessment/plan: Patient with clinical evidence of bacterial vaginosis will be treated with Tindamax 500 mg tablets. She will take 4 tablets today repeat in 24 hours. GC and Chlamydia culture was obtained today result pending at this time. Patient also will stop by the lab to have an HIV, RPR, hepatitis B and C to complete the STD screen. Literature information was provided on the Mirena IUD in the event that she wants to switch to this form of contraception before she moves to [place].

## 2014-10-06 NOTE — Patient Instructions (Signed)
Tinidazole tablets What is this medicine? TINIDAZOLE (tye NI da zole) is an antiinfective. It is used to treat amebiasis, giardiasis, trichomoniasis, and vaginosis. It will not work for colds, flu, or other viral infections. This medicine may be used for other purposes; ask your health care provider or pharmacist if you have questions. COMMON BRAND NAME(S): Tindamax What should I tell my health care provider before I take this medicine? They need to know if you have any of these conditions: -anemia or other blood disorders -if you frequently drink alcohol containing drinks -receiving hemodialysis -seizure disorder -an unusual or allergic reaction to tinidazole, other medicines, foods, dyes, or preservatives -pregnant or trying to get pregnant -breast-feeding How should I use this medicine? Take this medicine by mouth with a full glass of water. Follow the directions on the prescription label. Take with food. Take your medicine at regular intervals. Do not take your medicine more often than directed. Take all of your medicine as directed even if you think you are better. Do not skip doses or stop your medicine early. Talk to your pediatrician regarding the use of this medicine in children. While this drug may be prescribed for children as young as 3 years of age for selected conditions, precautions do apply. Overdosage: If you think you have taken too much of this medicine contact a poison control center or emergency room at once. NOTE: This medicine is only for you. Do not share this medicine with others. What if I miss a dose? If you miss a dose, take it as soon as you can. If it is almost time for your next dose, take only that dose. Do not take double or extra doses. What may interact with this medicine? Do not take this medicine with any of the following medications: -alcohol or any product that contains alcohol -amprenavir oral solution -disulfiram -paclitaxel injection -ritonavir  oral solution -sertraline oral solution -sulfamethoxazole-trimethoprim injection This medicine may also interact with the following medications: -cholestyramine -cimetidine -conivaptan -cyclosporin -fluorouracil -fosphenytoin, phenytoin -ketoconazole -lithium -phenobarbital -tacrolimus -warfarin This list may not describe all possible interactions. Give your health care provider a list of all the medicines, herbs, non-prescription drugs, or dietary supplements you use. Also tell them if you smoke, drink alcohol, or use illegal drugs. Some items may interact with your medicine. What should I watch for while using this medicine? Tell your doctor or health care professional if your symptoms do not improve or if they get worse. Avoid alcoholic drinks while you are taking this medicine and for three days afterward. Alcohol may make you feel dizzy, sick, or flushed. If you are being treated for a sexually transmitted disease, avoid sexual contact until you have finished your treatment. Your sexual partner may also need treatment. What side effects may I notice from receiving this medicine? Side effects that you should report to your doctor or health care professional as soon as possible: -allergic reactions like skin rash, itching or hives, swelling of the face, lips, or tongue -breathing problems -confusion, depression -dark or white patches in the mouth -feeling faint or lightheaded, falls -fever, infection -numbness, tingling, pain or weakness in the hands or feet -pain when passing urine -seizures -unusually weak or tired -vaginal irritation or discharge -vomiting Side effects that usually do not require medical attention (report to your doctor or health care professional if they continue or are bothersome): -dark brown or reddish urine -diarrhea -headache -loss of appetite -metallic taste -nausea -stomach upset This list may not describe all   possible side effects. Call your  doctor for medical advice about side effects. You may report side effects to FDA at 1-800-FDA-1088. Where should I keep my medicine? Keep out of the reach of children. Store at room temperature between 15 and 30 degrees C (59 and 86 degrees F). Protect from light and moisture. Keep container tightly closed. Throw away any unused medicine after the expiration date. NOTE: This sheet is a summary. It may not cover all possible information. If you have questions about this medicine, talk to your doctor, pharmacist, or health care provider.  2015, Elsevier/Gold Standard. (2007-11-05 15:22:28) Bacterial Vaginosis Bacterial vaginosis is a vaginal infection that occurs when the normal balance of bacteria in the vagina is disrupted. It results from an overgrowth of certain bacteria. This is the most common vaginal infection in women of childbearing age. Treatment is important to prevent complications, especially in pregnant women, as it can cause a premature delivery. CAUSES  Bacterial vaginosis is caused by an increase in harmful bacteria that are normally present in smaller amounts in the vagina. Several different kinds of bacteria can cause bacterial vaginosis. However, the reason that the condition develops is not fully understood. RISK FACTORS Certain activities or behaviors can put you at an increased risk of developing bacterial vaginosis, including:  Having a new sex partner or multiple sex partners.  Douching.  Using an intrauterine device (IUD) for contraception. Women do not get bacterial vaginosis from toilet seats, bedding, swimming pools, or contact with objects around them. SIGNS AND SYMPTOMS  Some women with bacterial vaginosis have no signs or symptoms. Common symptoms include:  Grey vaginal discharge.  A fishlike odor with discharge, especially after sexual intercourse.  Itching or burning of the vagina and vulva.  Burning or pain with urination. DIAGNOSIS  Your health care  provider will take a medical history and examine the vagina for signs of bacterial vaginosis. A sample of vaginal fluid may be taken. Your health care provider will look at this sample under a microscope to check for bacteria and abnormal cells. A vaginal pH test may also be done.  TREATMENT  Bacterial vaginosis may be treated with antibiotic medicines. These may be given in the form of a pill or a vaginal cream. A second round of antibiotics may be prescribed if the condition comes back after treatment.  HOME CARE INSTRUCTIONS   Only take over-the-counter or prescription medicines as directed by your health care provider.  If antibiotic medicine was prescribed, take it as directed. Make sure you finish it even if you start to feel better.  Do not have sex until treatment is completed.  Tell all sexual partners that you have a vaginal infection. They should see their health care provider and be treated if they have problems, such as a mild rash or itching.  Practice safe sex by using condoms and only having one sex partner. SEEK MEDICAL CARE IF:   Your symptoms are not improving after 3 days of treatment.  You have increased discharge or pain.  You have a fever. MAKE SURE YOU:   Understand these instructions.  Will watch your condition.  Will get help right away if you are not doing well or get worse. FOR MORE INFORMATION  Centers for Disease Control and Prevention, Division of STD Prevention: www.cdc.gov/std American Sexual Health Association (ASHA): www.ashastd.org  Document Released: 02/07/2005 Document Revised: 11/28/2012 Document Reviewed: 09/19/2012 [employer] Patient Information 2015 [employer]. This information is not intended to replace advice given to you   by your health care provider. Make sure you discuss any questions you have with your health care provider.  

## 2014-10-07 ENCOUNTER — Telehealth: Payer: Self-pay | Admitting: Gynecology

## 2014-10-07 ENCOUNTER — Ambulatory Visit: Payer: BLUE CROSS/BLUE SHIELD | Admitting: Women's Health

## 2014-10-07 LAB — GC/CHLAMYDIA PROBE AMP
CT PROBE, AMP APTIMA: NEGATIVE
GC Probe RNA: NEGATIVE

## 2014-10-07 LAB — HEPATITIS B SURFACE ANTIGEN: Hepatitis B Surface Ag: NEGATIVE

## 2014-10-07 LAB — HEPATITIS C ANTIBODY: HCV Ab: NEGATIVE

## 2014-10-07 LAB — HIV ANTIBODY (ROUTINE TESTING W REFLEX): HIV 1&2 Ab, 4th Generation: NONREACTIVE

## 2014-10-07 LAB — RPR

## 2014-10-07 NOTE — Telephone Encounter (Signed)
10/07/14-I LM VM for pt that her BC ins will cover the Mirena at 100%, no copay or deductible, for contraception. Advised to be inserted while on cycle by JF. Pt to call if she wants to proceed.wl

## 2015-05-01 ENCOUNTER — Encounter: Payer: BLUE CROSS/BLUE SHIELD | Admitting: Women's Health

## 2016-07-06 ENCOUNTER — Encounter: Payer: Self-pay | Admitting: Gynecology

## 2017-01-10 ENCOUNTER — Encounter (HOSPITAL_COMMUNITY): Payer: Self-pay | Admitting: Emergency Medicine

## 2017-01-10 ENCOUNTER — Emergency Department (HOSPITAL_COMMUNITY)
Admission: EM | Admit: 2017-01-10 | Discharge: 2017-01-10 | Disposition: A | Discharge status: Home / Self Care | Attending: Emergency Medicine | Admitting: Emergency Medicine

## 2017-01-10 ENCOUNTER — Emergency Department (HOSPITAL_COMMUNITY)

## 2017-01-10 DIAGNOSIS — E876 Hypokalemia: Secondary | ICD-10-CM | POA: Diagnosis not present

## 2017-01-10 DIAGNOSIS — Z91040 Latex allergy status: Secondary | ICD-10-CM | POA: Insufficient documentation

## 2017-01-10 DIAGNOSIS — Z79899 Other long term (current) drug therapy: Secondary | ICD-10-CM | POA: Insufficient documentation

## 2017-01-10 DIAGNOSIS — R002 Palpitations: Secondary | ICD-10-CM

## 2017-01-10 DIAGNOSIS — F419 Anxiety disorder, unspecified: Secondary | ICD-10-CM | POA: Insufficient documentation

## 2017-01-10 DIAGNOSIS — R079 Chest pain, unspecified: Secondary | ICD-10-CM | POA: Diagnosis present

## 2017-01-10 HISTORY — DX: Sickle-cell trait: D57.3

## 2017-01-10 HISTORY — DX: Gestational diabetes mellitus in pregnancy, unspecified control: O24.419

## 2017-01-10 HISTORY — DX: Unspecified pre-eclampsia, unspecified trimester: O14.90

## 2017-01-10 LAB — BASIC METABOLIC PANEL
Anion gap: 8 (ref 5–15)
BUN: 8 mg/dL (ref 6–20)
CHLORIDE: 106 mmol/L (ref 101–111)
CO2: 25 mmol/L (ref 22–32)
CREATININE: 0.92 mg/dL (ref 0.44–1.00)
Calcium: 9.3 mg/dL (ref 8.9–10.3)
GFR calc Af Amer: >60 mL/min (ref >60)
GFR calc non Af Amer: >60 mL/min (ref >60)
Glucose, Bld: 117 mg/dL — ABNORMAL HIGH (ref 65–99)
Potassium: 3.4 mmol/L — ABNORMAL LOW (ref 3.5–5.1)
SODIUM: 139 mmol/L (ref 135–145)

## 2017-01-10 LAB — CBC
HCT: 37.8 % (ref 36.0–46.0)
Hemoglobin: 12.9 g/dL (ref 12.0–15.0)
MCH: 29.5 pg (ref 26.0–34.0)
MCHC: 34.1 g/dL (ref 30.0–36.0)
MCV: 86.3 fL (ref 78.0–100.0)
PLATELETS: 307 10*3/uL (ref 150–400)
RBC: 4.38 MIL/uL (ref 3.87–5.11)
RDW: 13.1 % (ref 11.5–15.5)
WBC: 8.3 10*3/uL (ref 4.0–10.5)

## 2017-01-10 LAB — I-STAT TROPONIN, ED: Troponin i, poc: 0 ng/mL (ref 0.00–0.08)

## 2017-01-10 NOTE — ED Notes (Signed)
Patient is in the room into a gown placed on monitor patient is resting with call bell in reach

## 2017-01-10 NOTE — ED Notes (Addendum)
Pt stated in lobby pain in chest increasing, stated it feels the same as it did before.

## 2017-01-10 NOTE — ED Provider Notes (Signed)
[facility] Provider Note   CSN: [id number] Arrival date & time: 01/10/17  [id number]     History   Chief Complaint Chief Complaint  Patient presents with  . Chest Pain    HPI Burr MedicoMalaunia Klebba is a 21 y.o. female.  HPI Patient is currently wearing an event monitor for history of palpitations.  She has had echo by cardiology this year that was normal.  History is positive for anxiety attacks.  Patient was recently started on Paxil.  She reports last night she started getting episodes of racing of her heart that were waking her up.  She reports when that would happen she would breathe more rapidly.  She then subsequently felt lightheaded.  She had been seen for similar symptoms 2 days ago.  Patient is not having symptoms at this time.  She has not having lower extremity swelling or calf pain.  No fever no cough.  She reports she always has an elevated d-dimer when it is checked.  No history of PE. Past Medical History:  Diagnosis Date  . Arthritis   . GERD (gastroesophageal reflux disease)   . Gestational diabetes   . Preeclampsia   . Sickle cell trait (HCC)     There are no active problems to display for this patient.   Past Surgical History:  Procedure Laterality Date  . CESAREAN SECTION    . HERNIA REPAIR    . WISDOM TOOTH EXTRACTION      OB History    No data available       Home Medications    Prior to Admission medications   Medication Sig Start Date End Date Taking? Authorizing Provider  Fluticasone-Salmeterol (ADVAIR DISKUS) 100-50 MCG/DOSE AEPB Inhale 1 puff into the lungs every 12 (twelve) hours. 12/20/16  Yes [provider]  Multiple Vitamin (MULTIVITAMIN WITH MINERALS) TABS tablet Take 1 tablet by mouth daily.   Yes [provider]  PARoxetine (PAXIL) 20 MG tablet Take 20 mg by mouth daily.   Yes [provider]  PROAIR HFA 108 (90 Base) MCG/ACT inhaler Inhale 2 puffs into the lungs every 6  (six) hours as needed for shortness of breath. 11/23/16   [provider]  ranitidine (ZANTAC) 150 MG tablet Take 150 mg by mouth daily as needed for heartburn.    [provider]    Family History No family history on file.  Social History Social History   Tobacco Use  . Smoking status: Never Smoker  . Smokeless tobacco: Never Used  Substance Use Topics  . Alcohol use: No    Frequency: Never  . Drug use: No     Allergies   Bee venom; Shellfish allergy; and Latex   Review of Systems Review of Systems 10 Systems reviewed and are negative for acute change except as noted in the HPI.   Physical Exam Updated Vital Signs BP 120/73 (BP Location: Right Arm)   Pulse (!) 104   Temp 98.7 F (37.1 C) (Oral)   Resp 17   Ht 5\' 5"  (1.651 m)   Wt 93 kg (205 lb)   LMP 12/10/2016 (Approximate)   SpO2 100%   BMI 34.11 kg/m   Physical Exam  Constitutional: She is oriented to person, place, and time. She appears well-developed and well-nourished. No distress.  HENT:  Head: Normocephalic and atraumatic.  Mouth/Throat: Oropharynx is clear and moist.  Eyes: Conjunctivae and EOM are normal. Pupils are equal, round, and reactive to light.  Neck:  Neck supple.  Cardiovascular: Normal rate, regular rhythm, normal heart sounds and intact distal pulses.  No murmur heard. Patient is wearing an event monitor  Pulmonary/Chest: Effort normal and breath sounds normal. No respiratory distress.  Abdominal: Soft. She exhibits no distension. There is no tenderness. There is no guarding.  Musculoskeletal: Normal range of motion. She exhibits no edema or tenderness.  Neurological: She is alert and oriented to person, place, and time. No cranial nerve deficit. She exhibits normal muscle tone. Coordination normal.  Skin: Skin is warm and dry.  Psychiatric: She has a normal mood and affect.  Nursing note and vitals reviewed.    ED Treatments / Results  Labs (all labs ordered are  listed, but only abnormal results are displayed) Labs Reviewed  BASIC METABOLIC PANEL - Abnormal; Notable for the following components:      Result Value   Potassium 3.4 (*)    Glucose, Bld 117 (*)    All other components within normal limits  CBC  I-STAT TROPONIN, ED    EKG  EKG Interpretation  Date/Time:  Tuesday January 10 2017 02:45:12 EST Ventricular Rate:  114 PR Interval:  164 QRS Duration: 70 QT Interval:  318 QTC Calculation: 438 R Axis:   50 Text Interpretation:  Sinus tachycardia Otherwise normal ECG agree. no old comparison. Confirmed by Arby BarrettePfeiffer, Thia Olesen [phone]([id number]) on 01/10/2017 10:24:48 AM       Radiology Dg Chest 2 View  Result Date: 01/10/2017 CLINICAL DATA:  21 year old female with chest pain. EXAM: CHEST  2 VIEW COMPARISON:  None. FINDINGS: The lungs are clear. There is no pleural effusion or pneumothorax. The cardiac silhouette is within normal limits. The cardiac monitor device overlies the patient. No acute osseous pathology. IMPRESSION: No active cardiopulmonary disease. Electronically Signed   By: Elgie CollardArash  Radparvar M.D.   On: 01/10/2017 03:17    Procedures Procedures (including critical care time)  Medications Ordered in ED Medications - No data to display   Initial Impression / Assessment and Plan / ED Course  I have reviewed the triage vital signs and the nursing notes.  Pertinent labs & imaging results that were available during my care of the patient were reviewed by me and considered in my medical decision making (see chart for details).     Final Clinical Impressions(s) / ED Diagnoses   Final diagnoses:  Anxiety  Palpitations  Hypokalemia  Patient is clinically well.  Diagnostic workup does not suggest cardiac ischemic etiology.  Patient has already had a echocardiogram known to be normal.  Chest x-ray is clear.  Similar symptoms have been occurring for quite some time.  At this time I have low suspicion for cardiac dysfunction or  dysrhythmia other than palpitations, sinus tach and possible paroxysmal SVT.  Patient is currently getting full cardiac evaluation and this event will be able to be reviewed.  At this time I do not intend to start patient on beta-blockers.  I also feel she should continue her Paxil as started as this may help once she is through the initial start up phase.  Patient is stable for discharge.  She has appropriate follow-up with psychology, cardiology and PCP.  ED Discharge Orders    None       Arby BarrettePfeiffer, Kealii Thueson, MD 01/10/17 1037

## 2017-01-10 NOTE — ED Notes (Signed)
Pt states she understands instructions. Home stable with family\. 

## 2017-01-10 NOTE — Discharge Instructions (Signed)
1.  Follow-up with your doctor as scheduled. 2.  Increase your intake of dietary potassium.

## 2017-01-10 NOTE — ED Triage Notes (Signed)
Pt reports CP present since 11p with SOB, nausea, dizziness. Pt had similar episode early Monday morning and went to [facility], they informed her she was possibly in SVT.

## 2017-06-01 ENCOUNTER — Inpatient Hospital Stay (HOSPITAL_COMMUNITY)
Admission: AD | Admit: 2017-06-01 | Discharge: 2017-06-01 | Disposition: A | Discharge status: Home / Self Care | Source: Ambulatory Visit | Attending: Obstetrics and Gynecology | Admitting: Obstetrics and Gynecology

## 2017-06-01 ENCOUNTER — Encounter (HOSPITAL_COMMUNITY): Payer: Self-pay | Admitting: *Deleted

## 2017-06-01 ENCOUNTER — Other Ambulatory Visit: Payer: Self-pay

## 2017-06-01 ENCOUNTER — Encounter: Payer: Self-pay | Admitting: Gynecology

## 2017-06-01 ENCOUNTER — Inpatient Hospital Stay (HOSPITAL_COMMUNITY)

## 2017-06-01 DIAGNOSIS — Z3A10 10 weeks gestation of pregnancy: Secondary | ICD-10-CM | POA: Insufficient documentation

## 2017-06-01 DIAGNOSIS — O209 Hemorrhage in early pregnancy, unspecified: Secondary | ICD-10-CM | POA: Diagnosis not present

## 2017-06-01 LAB — CBC
HEMATOCRIT: 34.5 % — AB (ref 36.0–46.0)
Hemoglobin: 12.2 g/dL (ref 12.0–15.0)
MCH: 29.8 pg (ref 26.0–34.0)
MCHC: 35.4 g/dL (ref 30.0–36.0)
MCV: 84.1 fL (ref 78.0–100.0)
PLATELETS: 257 10*3/uL (ref 150–400)
RBC: 4.1 MIL/uL (ref 3.87–5.11)
RDW: 13 % (ref 11.5–15.5)
WBC: 5.8 10*3/uL (ref 4.0–10.5)

## 2017-06-01 LAB — WET PREP, GENITAL
Clue Cells Wet Prep HPF POC: NONE SEEN
Sperm: NONE SEEN
TRICH WET PREP: NONE SEEN
Yeast Wet Prep HPF POC: NONE SEEN

## 2017-06-01 LAB — POCT PREGNANCY, URINE: PREG TEST UR: POSITIVE — AB

## 2017-06-01 LAB — URINALYSIS, ROUTINE W REFLEX MICROSCOPIC
BACTERIA UA: NONE SEEN
BILIRUBIN URINE: NEGATIVE
Glucose, UA: NEGATIVE mg/dL
KETONES UR: NEGATIVE mg/dL
LEUKOCYTES UA: NEGATIVE
NITRITE: NEGATIVE
PROTEIN: NEGATIVE mg/dL
SPECIFIC GRAVITY, URINE: 1.015 (ref 1.005–1.030)
pH: 6 (ref 5.0–8.0)

## 2017-06-01 NOTE — MAU Note (Signed)
Pt presents with c/o lower abdominal cramping and VB that began this morning.  Reports passed 1 clot. Visiting from [place].

## 2017-06-01 NOTE — Discharge Instructions (Signed)
[facility] Providers  ° ° °[facility]       Phone: [phone] ° °[facility] Phone: [phone] ° °[facility] [place]  Phone: [phone] ° °[facility] [place] [facility]  Phone: [phone] ° °[facility]  Phone: [phone] ° °[facility]       Phone: [phone] ° °[facility]    Phone: [phone]  ° °[facility]    Phone: [phone] ° °[facility]    Phone: [phone] ° °[facility]    Phone: [phone] ° °[facility]    Phone: [phone] ° °[facility]       Phone: [phone]  ° °[facility]  Phone: [phone] ° °[facility]    Phone: [phone] ° °[facility]   Phone: [phone] ° °[facility]      Phone: [phone] ° °[facility]    Phone: [phone] ° °Safe Medications in Pregnancy  ° °Acne: °Benzoyl Peroxide °Salicylic Acid ° °Backache/Headache: °Tylenol: 2 regular strength every 4 hours OR °             2 Extra strength every 6 hours ° °Colds/Coughs/Allergies: °Benadryl (alcohol free) 25 mg every 6 hours as needed °Breath right strips °Claritin °Cepacol throat lozenges °Chloraseptic throat spray °Cold-Eeze- up to three times per day °Cough drops, alcohol free °Flonase (by prescription only) °Guaifenesin °Mucinex °Robitussin DM (plain only, alcohol free) °Saline nasal spray/drops °Sudafed (pseudoephedrine) & Actifed ** use only after [redacted] weeks gestation and if you do not have high blood pressure °Tylenol °Vicks Vaporub °Zinc lozenges °Zyrtec  ° °Constipation: °Colace °Ducolax suppositories °Fleet enema °Glycerin suppositories °Metamucil °Milk of  magnesia °Miralax °Senokot °Smooth move tea ° °Diarrhea: °Kaopectate °Imodium A-D ° °*NO pepto Bismol ° °Hemorrhoids: °Anusol °Anusol HC °Preparation H °Tucks ° °Indigestion: °Tums °Maalox °Mylanta °Zantac  °Pepcid ° °Insomnia: °Benadryl (alcohol free) 25mg every 6 hours as needed °Tylenol PM °Unisom, no Gelcaps ° °Leg Cramps: °Tums °MagGel ° °Nausea/Vomiting:  °Bonine °Dramamine °Emetrol °Ginger extract °Sea bands °Meclizine  °Nausea medication to take during pregnancy:  °Unisom (doxylamine succinate 25 mg tablets) Take one tablet daily at bedtime. If symptoms are not adequately controlled, the dose can be increased to a maximum recommended dose of two tablets daily (1/2 tablet in the morning, 1/2 tablet mid-afternoon and one at bedtime). °Vitamin B6 100mg tablets. Take one tablet twice a day (up to 200 mg per day). ° °Skin Rashes: °Aveeno products °Benadryl cream or 25mg every 6 hours as needed °Calamine Lotion °1% cortisone cream ° °Yeast infection: °Gyne-lotrimin 7 °Monistat 7 ° ° °**If taking multiple medications, please check labels to avoid duplicating the same active ingredients °**take medication as directed on the label °** Do not exceed 4000 mg of tylenol in 24 hours °**Do not take medications that contain aspirin or ibuprofen ° ° ° ° °

## 2017-06-01 NOTE — MAU Provider Note (Signed)
History     CSN: [id number]  Arrival date and time: 06/01/17 0844   First Provider Initiated Contact with Patient 06/01/17 1010      Chief Complaint  Patient presents with  . Abdominal Pain  . Vaginal Bleeding   HPI  Ms.  Charlene Cros[place] is a 22 y.o. year old G63P1011 female at [redacted]w[redacted]d weeks gestation who presents to MAU reporting VB, some abdominal cramping that began this morning and passed 1 clot. She is here visiting from [place] for the funeral of a family member. She reports having SI yesterday afternoon prior to flying here from [place]. She had her first prenatal visit on yesterday. Per pt and care everywhere, she had a normal OB US and [place] for DVT in LT LE; which was negative for DVT. She had a previous stat CS for cord prolapse and plans to [facility].   Past Medical History:  Diagnosis Date  . Asthma   . GERD (gastroesophageal reflux disease)   . Gestational diabetes   . Preeclampsia   . Sickle cell trait [facility])     Past Surgical History:  Procedure Laterality Date  . CESAREAN SECTION    . HERNIA REPAIR    . WISDOM TOOTH EXTRACTION      Family History  Problem Relation Age of Onset  . Diabetes Mother     Social History   Tobacco Use  . Smoking status: Never Smoker  . Smokeless tobacco: Never Used  Substance Use Topics  . Alcohol use: No    Frequency: Never  . Drug use: No    Allergies:  Allergies  Allergen Reactions  . Bee Venom Anaphylaxis  . Shellfish Allergy Anaphylaxis  . Sumatriptan Succinate Shortness Of Breath, Nausea Only and Palpitations    Made headache worse  . Latex Hives    Medications Prior to Admission  Medication Sig Dispense Refill Last Dose  . acetaminophen (TYLENOL) 500 MG tablet Take 500 mg by mouth every 6 (six) hours as needed for mild pain or headache.   Past Week at Unknown time  . albuterol (ACCUNEB) 0.63 MG/3ML nebulizer solution Inhale 1 ampule into the lungs every 6 (six) hours as needed for shortness of breath.    prn  .  cetirizine (ZYRTEC) 10 MG tablet Take 10 mg by mouth daily as needed for allergies.   prn  . fluticasone (FLONASE) 50 MCG/ACT nasal spray Place 2 sprays into both nostrils daily as needed for allergies or rhinitis.   prn  . Prenatal Vit-Fe Fumarate-FA (PRENATAL MULTIVITAMIN) TABS tablet Take 1 tablet by mouth daily at 12 noon.   05/31/2017 at Unknown time  . PROAIR HFA 108 (90 Base) MCG/ACT inhaler Inhale 2 puffs into the lungs every 6 (six) hours as needed for shortness of breath.   05/31/2017 at Unknown time  . promethazine (PHENERGAN) 12.5 MG tablet Take 12.5 mg by mouth every 6 (six) hours as needed for nausea or vomiting.    prn  . ranitidine (ZANTAC) 150 MG tablet Take 150 mg by mouth daily as needed for heartburn.   05/31/2017 at Unknown time    Review of Systems  Constitutional: Negative.   HENT: Negative.   Eyes: Negative.   Respiratory: Negative.   Cardiovascular: Negative.   Gastrointestinal: Positive for nausea (on medication).  Endocrine: Negative.   Genitourinary: Positive for pelvic pain (lower cramping) and vaginal bleeding (dark, reddish-brown; passed 1 clot).  Musculoskeletal: Negative.   Skin: Negative.   Allergic/Immunologic: Negative.   Neurological: Negative.  Hematological: Negative.   Psychiatric/Behavioral: Negative.    Physical Exam   Blood pressure 121/81, pulse (!) 119, temperature 99.2 F (37.3 C), temperature source Oral, height 5' 5.5" (1.664 m), weight 206 lb 4 oz (93.6 kg), SpO2 100 %.  Physical Exam  Nursing note and vitals reviewed. Constitutional: She is oriented to person, place, and time. She appears well-developed and well-nourished.  HENT:  Head: Normocephalic and atraumatic.  Eyes: Pupils are equal, round, and reactive to light.  Neck: Normal range of motion.  Cardiovascular: Normal rate.  Respiratory: Effort normal.  GI: Soft.  Genitourinary:  Genitourinary Comments: Uterus: enlarged, non-tender, S=D, SE: cervix is smooth, pink, no  lesions, small amt of thick, reddish-brown blood -- WP, GC/CT done, closed/long/firm, no CMT or friability, no adnexal tenderness   Musculoskeletal: Normal range of motion.  Neurological: She is alert and oriented to person, place, and time.  Skin: Skin is warm and dry.  Psychiatric: She has a normal mood and affect. Her behavior is normal. Judgment and thought content normal.    MAU Course  Procedures  MDM CCUA UPT CBC OB US <14wks with TV FHTs by doppler: 174 bpm   Results for orders placed or performed during the hospital encounter of 06/01/17 (from the past 24 hour(s))  Urinalysis, Routine w reflex microscopic     Status: Abnormal   Collection Time: 06/01/17  8:47 AM  Result Value Ref Range   Color, Urine YELLOW YELLOW   APPearance HAZY (A) CLEAR   Specific Gravity, Urine 1.015 1.005 - 1.030   pH 6.0 5.0 - 8.0   Glucose, UA NEGATIVE NEGATIVE mg/dL   Hgb urine dipstick LARGE (A) NEGATIVE   Bilirubin Urine NEGATIVE NEGATIVE   Ketones, ur NEGATIVE NEGATIVE mg/dL   Protein, ur NEGATIVE NEGATIVE mg/dL   Nitrite NEGATIVE NEGATIVE   Leukocytes, UA NEGATIVE NEGATIVE   RBC / HPF 0-5 0 - 5 RBC/hpf   WBC, UA 0-5 0 - 5 WBC/hpf   Bacteria, UA NONE SEEN NONE SEEN   Squamous Epithelial / LPF 0-5 (A) NONE SEEN   Mucus PRESENT   Pregnancy, urine POC     Status: Abnormal   Collection Time: 06/01/17  9:31 AM  Result Value Ref Range   Preg Test, Ur POSITIVE (A) NEGATIVE  Wet prep, genital     Status: Abnormal   Collection Time: 06/01/17 10:00 AM  Result Value Ref Range   Yeast Wet Prep HPF POC NONE SEEN NONE SEEN   Trich, Wet Prep NONE SEEN NONE SEEN   Clue Cells Wet Prep HPF POC NONE SEEN NONE SEEN   WBC, Wet Prep HPF POC FEW (A) NONE SEEN   Sperm NONE SEEN   CBC     Status: Abnormal   Collection Time: 06/01/17 10:15 AM  Result Value Ref Range   WBC 5.8 4.0 - 10.5 K/uL   RBC 4.10 3.87 - 5.11 MIL/uL   Hemoglobin 12.2 12.0 - 15.0 g/dL   HCT 16.134.5 (L) 09.636.0 - 04.546.0 %   MCV 84.1  78.0 - 100.0 fL   MCH 29.8 26.0 - 34.0 pg   MCHC 35.4 30.0 - 36.0 g/dL   RDW 40.913.0 81.111.5 - 91.415.5 %   Platelets 257 150 - 400 K/uL   [place] Ob Comp Less 14 Wks  Result Date: 06/01/2017 CLINICAL DATA:  Bleeding EXAM: OBSTETRIC <14 WK ULTRASOUND TECHNIQUE: Transabdominal ultrasound was performed for evaluation of the gestation as well as the maternal uterus and adnexal regions. COMPARISON:  None. FINDINGS:  Intrauterine gestational sac: Single Yolk sac:  Visualized Embryo:  Visualized Cardiac Activity: Visualized Heart Rate: 174 bpm MSD:   mm    w     d CRL:   44.4 mm   11 w 1 d                  [place] EDC: 12/20/2017 Subchorionic hemorrhage:  None visualized. Maternal uterus/adnexae: No adnexal mass or free fluid. IMPRESSION: Eleven week 1 day intrauterine pregnancy. Fetal heart rate 174 beats per minute. Electronically Signed   By: Charlett Nose M.D.   On: 06/01/2017 10:51   Assessment and Plan  Bleeding in early pregnancy  - Reassurance given that [place] was normal - Advised that bleeding is more than likely from recent SI - Bleeding precautions reviewed - Advised for pelvic rest until bleeding stops - [place] [facility] provider list given, in case she moves here and wants to transfer her care  Raelyn Mora, MSN, CNM 06/01/2017, 10:50 AM

## 2017-06-02 LAB — GC/CHLAMYDIA PROBE AMP (~~LOC~~) NOT AT ARMC
CHLAMYDIA, DNA PROBE: NEGATIVE
Neisseria Gonorrhea: NEGATIVE

## 2019-01-08 IMAGING — US US OB COMP LESS 14 WK
1 series · 15 of 24 positions shown · non-contrast
Comparison: None.

CLINICAL DATA: Bleeding

EXAM:
OBSTETRIC <14 WK ULTRASOUND
TECHNIQUE: Transabdominal ultrasound was performed for evaluation of the
gestation as well as the maternal uterus and adnexal regions.

[Series 1: us ob comp less 14 wk · 15 of 24 slices shown]
[im 1/24]
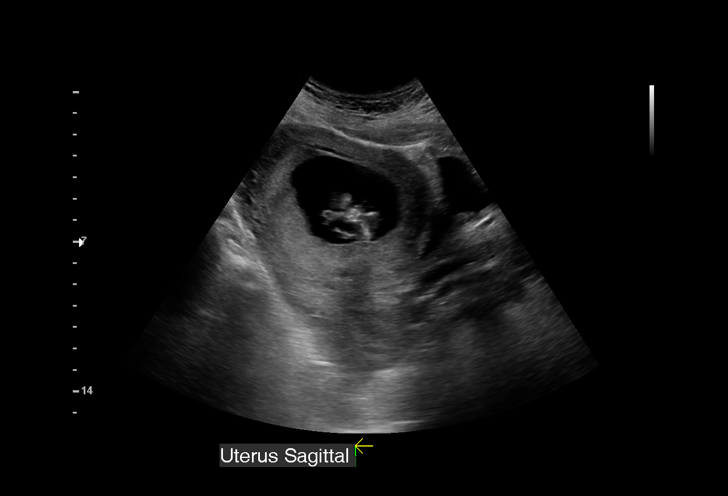
[im 3/24]
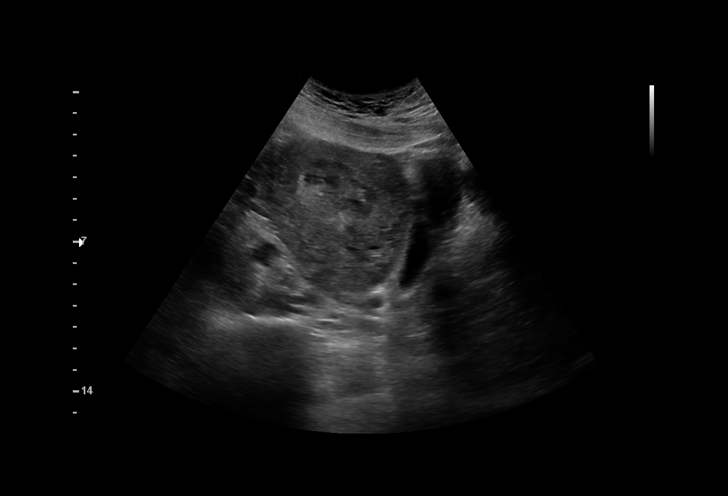
[im 5/24]
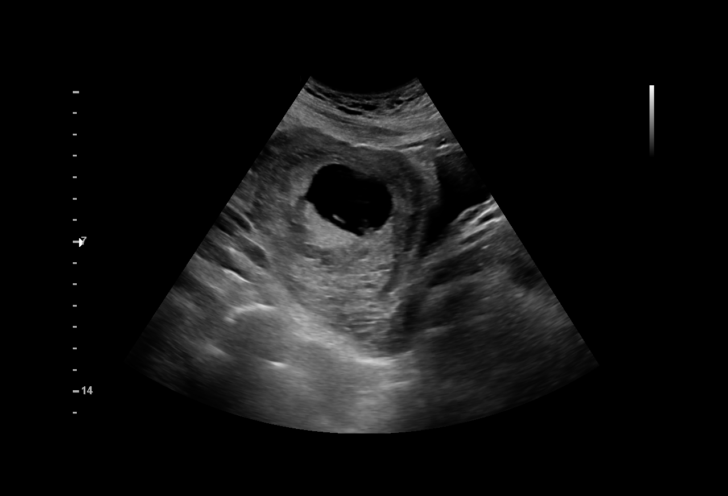
[im 6/24]
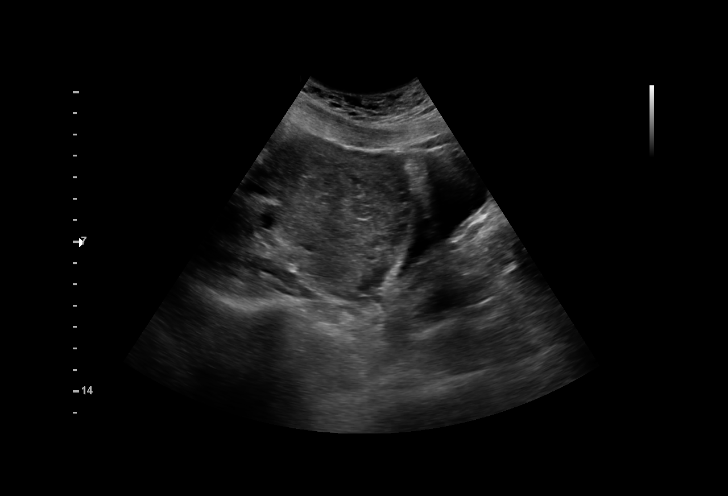
[im 8/24]
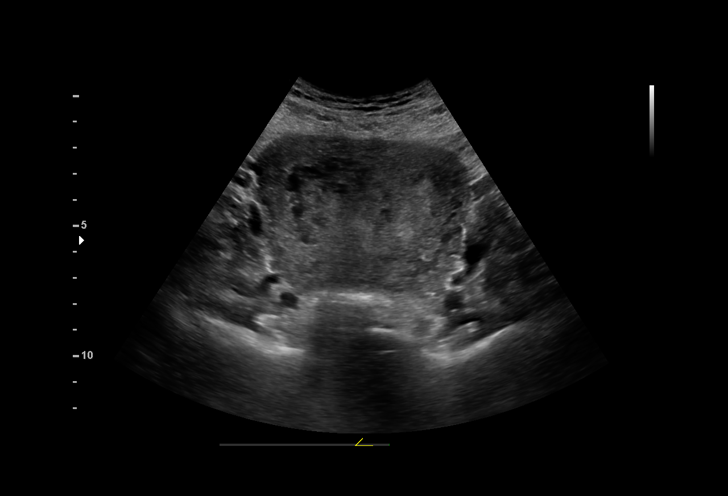
[im 9/24]
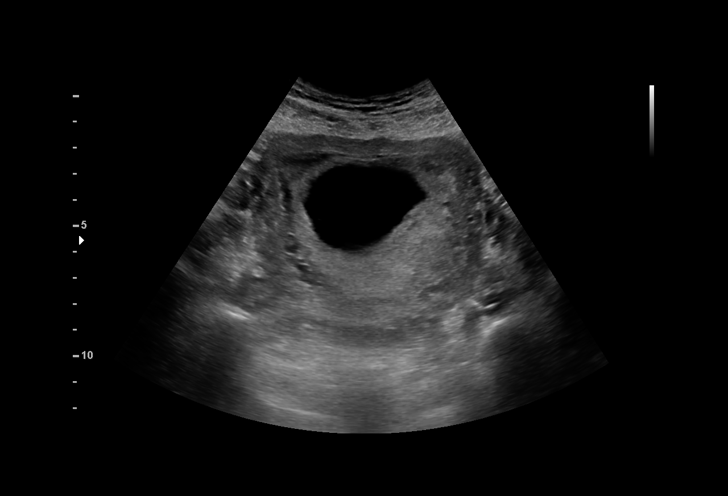
[im 11/24]
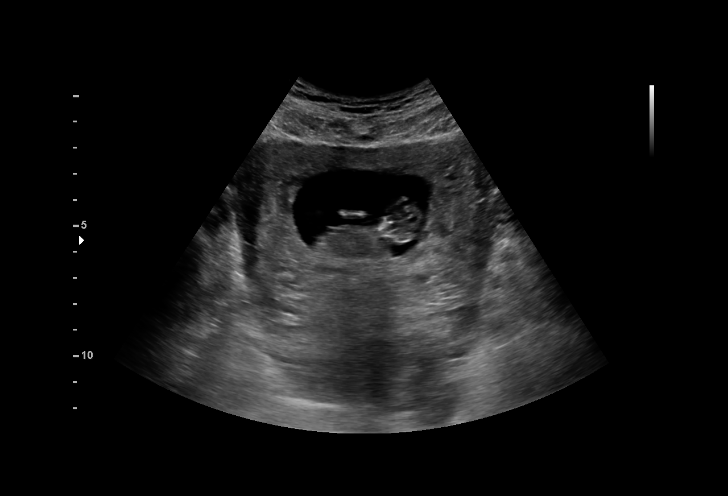
[im 13/24]
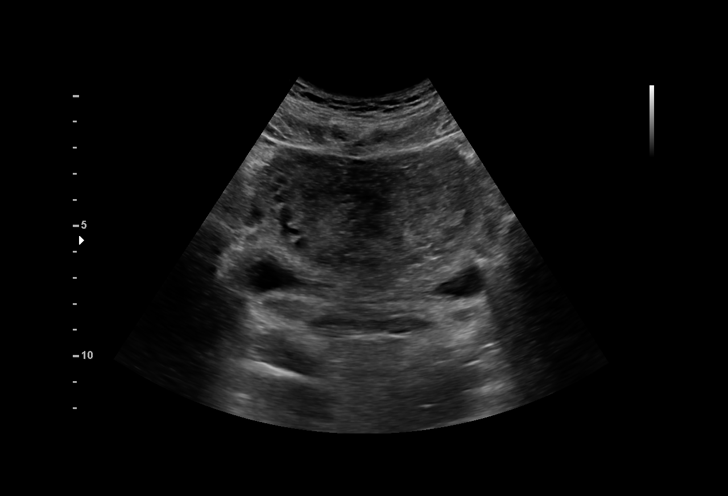
[im 14/24]
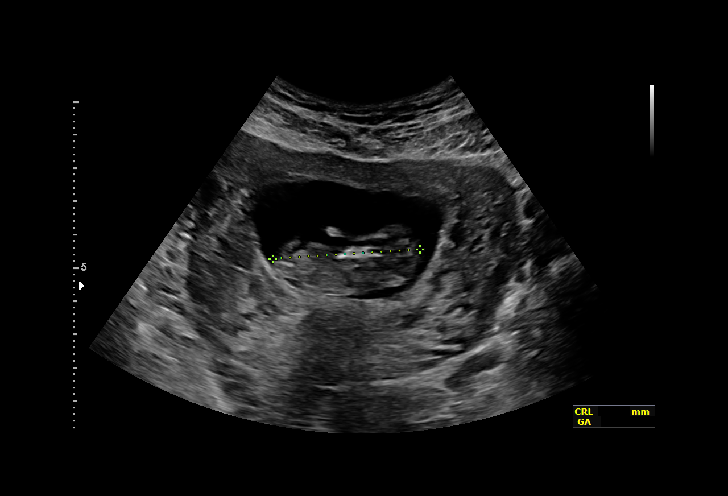
[im 16/24]
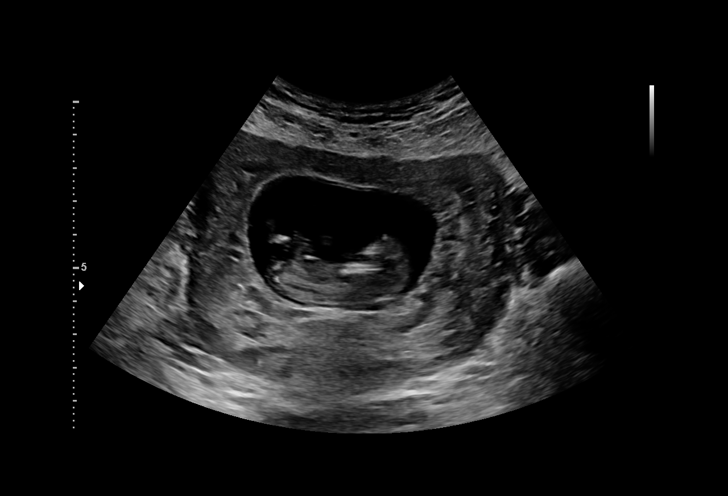
[im 17/24]
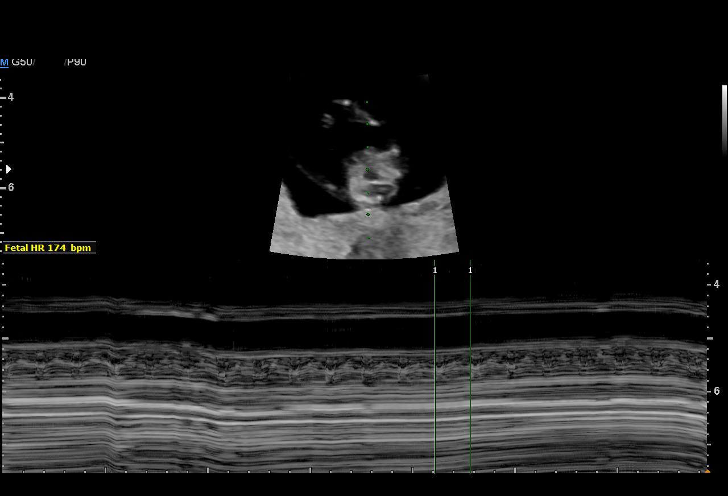
[im 19/24]
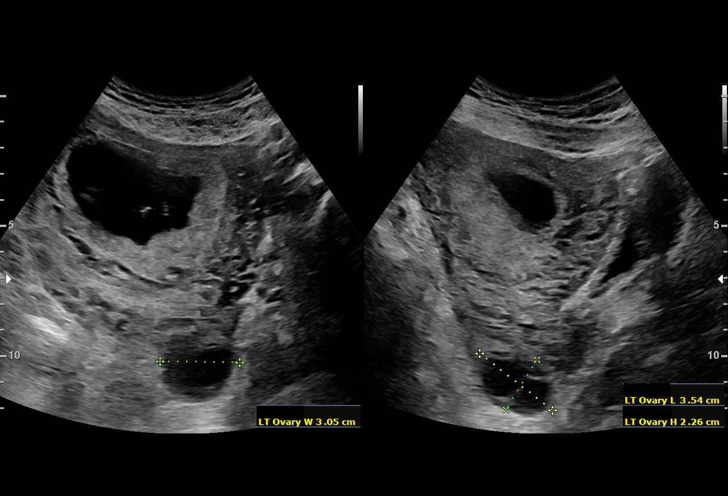
[im 21/24]
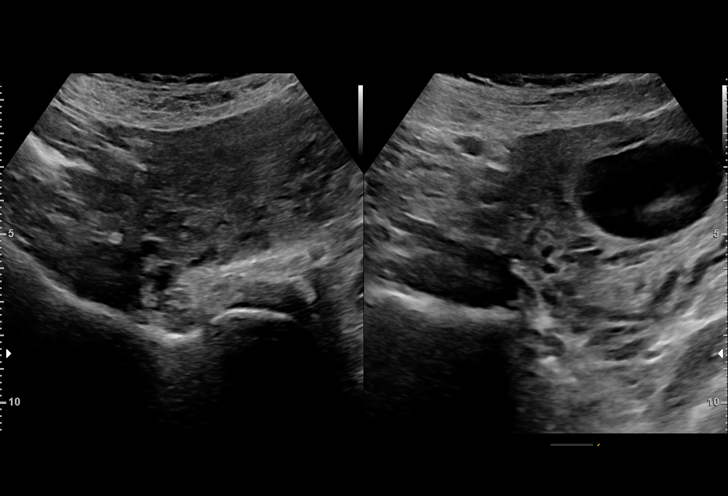
[im 22/24]
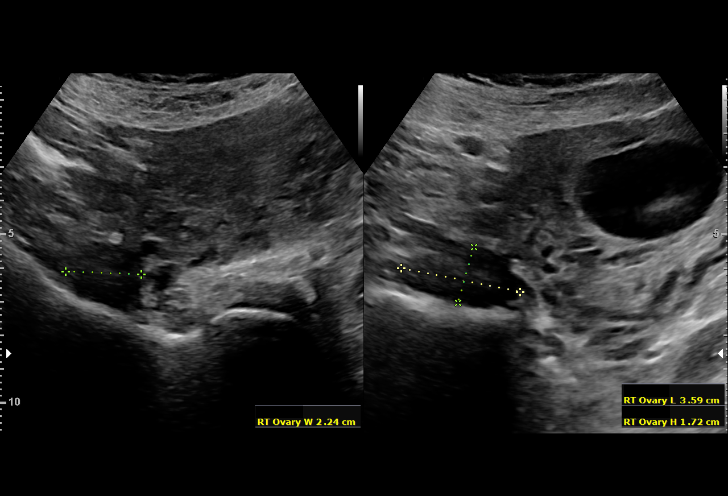
[im 24/24]
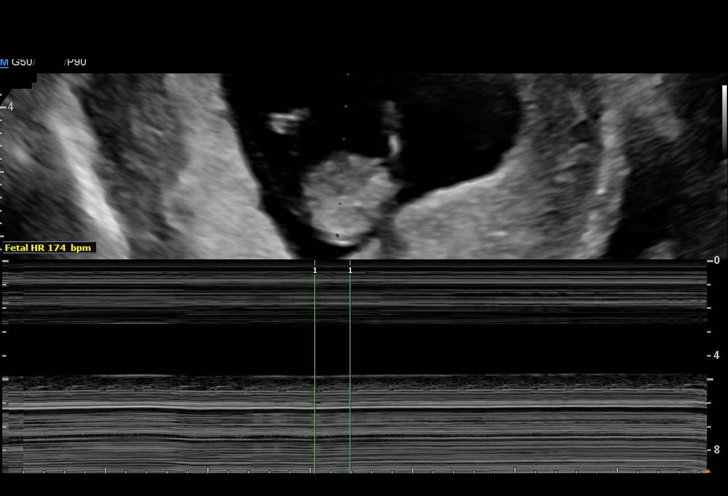

[15 of 24 positions shown; findings below may reference images not displayed]

FINDINGS: Intrauterine gestational sac: Single

Yolk sac:  Visualized

Embryo:  Visualized

Cardiac Activity: Visualized

Heart Rate: 174 bpm

MSD:   mm    w     d

CRL:   44.4 mm   11 w 1 d                  US EDC: 12/20/2017

Subchorionic hemorrhage:  None visualized.

Maternal uterus/adnexae: No adnexal mass or free fluid.
IMPRESSION: Eleven week 1 day intrauterine pregnancy. Fetal heart rate 174 beats
per minute.

## 2021-11-11 ENCOUNTER — Emergency Department
Admission: EM | Admit: 2021-11-11 | Discharge: 2021-11-11 | Disposition: A | Discharge status: Home / Self Care | Payer: Federal, State, Local not specified - PPO | Attending: Emergency Medicine | Admitting: Emergency Medicine

## 2021-11-11 ENCOUNTER — Encounter: Payer: Self-pay | Admitting: Emergency Medicine

## 2021-11-11 ENCOUNTER — Other Ambulatory Visit: Payer: Self-pay

## 2021-11-11 DIAGNOSIS — T7840XA Allergy, unspecified, initial encounter: Secondary | ICD-10-CM | POA: Diagnosis present

## 2021-11-11 MED ORDER — EPINEPHRINE 0.3 MG/0.3ML IJ SOAJ: 0.3000 mg | Freq: Once | INTRAMUSCULAR | 1 refills | Status: AC

## 2021-11-11 MED ORDER — FAMOTIDINE 20 MG PO TABS
20.0000 mg | ORAL_TABLET | ORAL | Status: AC
  Administered 2021-11-11: 20 mg via ORAL
  Filled 2021-11-11: qty 1

## 2021-11-11 MED ORDER — PREDNISONE 20 MG PO TABS: 40.0000 mg | ORAL_TABLET | Freq: Every day | ORAL | 0 refills | Status: AC

## 2021-11-11 MED ORDER — DIPHENHYDRAMINE HCL 25 MG PO CAPS
25.0000 mg | ORAL_CAPSULE | Freq: Once | ORAL | Status: AC
  Administered 2021-11-11: 25 mg via ORAL
  Filled 2021-11-11: qty 1

## 2021-11-11 MED ORDER — PREDNISONE 20 MG PO TABS
60.0000 mg | ORAL_TABLET | ORAL | Status: AC
  Administered 2021-11-11: 60 mg via ORAL
  Filled 2021-11-11: qty 3

## 2021-11-11 NOTE — ED Provider Notes (Signed)
[facility] Provider Note    Event Date/Time   First MD Initiated Contact with Patient 11/11/21 0330     (approximate)   History   No chief complaint on file.   HPI  [place] is a 26 y.o. female who presents for evaluation of allergic reaction.  She cannot think of anything in particular to which she came in contact that should have caused this type of reaction.  She took some Tylenol yesterday and then again this evening, but she has had acetaminophen in the past.  However she developed an itchy rash on her arms and then on her back before she went to sleep.  She woke up from sleep and she felt like the left side of her upper lip was numb and when she looked in the mirror she saw that it was swollen.  She also feels like her voice is a little bit worse.  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  She is not having any trouble breathing.  No nausea, vomiting, nor diarrhea.  No abdominal pain.  No known contact with seafood or pets/animals of any kind.  No other new recent medications.     Physical Exam   Triage Vital Signs: ED Triage Vitals  Enc Vitals Group     BP 11/11/21 0326 (!) 142/95     Pulse Rate 11/11/21 0326 (!) 111     Resp 11/11/21 0326 17     Temp 11/11/21 0326 98.7 F (37.1 C)     Temp Source 11/11/21 0326 Oral     SpO2 11/11/21 0326 94 %     Weight 11/11/21 0324 109.3 kg (241 lb)     Height 11/11/21 0324 1.651 m (5\' 5" )     Head Circumference --      Peak Flow --      Pain Score 11/11/21 0324 0     Pain Loc --      Pain Edu? --      Excl. in GC? --     Most recent vital signs: Vitals:   11/11/21 0326 11/11/21 0422  BP: (!) 142/95 (!) 138/90  Pulse: (!) 111 100  Resp: 17 16  Temp: 98.7 F (37.1 C)   SpO2: 94% 98%     General: Awake, no distress.  Well-appearing. CV:  Good  peripheral perfusion.  Mild tachycardia. Resp:  Normal effort.  Abd:  No distention.  Other:  Generalized urticarial rash on her back and on her arms.  No rash on her face.  Very minimal angioedema to the left upper lip, barely noticeable if she did not draw attention to it.  Oropharynx is clear, no airway impingement, no swelling or exudate in the back of her throat.   ED Results / Procedures / Treatments   Labs (all labs ordered are listed, but only abnormal results are displayed) Labs Reviewed - No data to display    PROCEDURES:  Critical Care performed: No  Procedures   MEDICATIONS ORDERED IN ED: Medications  predniSONE (DELTASONE) tablet 60 mg (60 mg Oral Given 11/11/21 0358)  famotidine (PEPCID) tablet 20 mg (20 mg Oral Given 11/11/21 0358)  diphenhydrAMINE (BENADRYL) capsule 25 mg (25 mg Oral Given 11/11/21 0358)     IMPRESSION / MDM / ASSESSMENT AND PLAN / ED COURSE  I reviewed  the triage vital signs and the nursing notes.                              Differential diagnosis includes, but is not limited to, allergic reaction, anaphylaxis, acute infectious process.  Patient's presentation is most consistent with acute presentation with potential threat to life or bodily function.  Patient's symptoms are relatively mild and consistent with allergic reaction but do not meet criteria for anaphylaxis.  I gave the patient the option of putting in an IV but explained I did not think she needed epinephrine.  She agrees with my recommendation to try oral medications.  I ordered diphenhydramine 25 mg by mouth, famotidine 20 mg by mouth, and prednisone 60 mg by mouth.  We will observe for several hours to make sure she is getting better and not worse.  I anticipate discharge with outpatient follow-up.   Clinical Course as of 11/11/21 2563  Thu Nov 11, 2021  0557 I reassessed the patient.  She feels less itching than before and she feels like her lip is better, it is still tingling  a little bit but she feels like it is not as swollen as before.  She is well-appearing.  No difficulty breathing, no wheezing, urticaria on her arms has resolved.  She is comfortable with going home.  I gave her my usual and customary allergic reaction management recommendations and return precautions.  In case she has a severe reaction in the future I will provide her with an EpiPen prescription but there is no indication that she needs to use it right now.  Short course of prednisone.  She agrees with the plan. [CF]    Clinical Course User Index [CF] Hinda Kehr, MD     FINAL CLINICAL IMPRESSION(S) / ED DIAGNOSES   Final diagnoses:  Allergic reaction, initial encounter     Rx / DC Orders   ED Discharge Orders          Ordered    EPINEPHrine (EPIPEN 2-PAK) 0.3 mg/0.3 mL IJ SOAJ injection   Once        11/11/21 0600    predniSONE (DELTASONE) 20 MG tablet  Daily with breakfast        11/11/21 0600             Note:  This document was prepared using Dragon voice recognition software and may include unintentional dictation errors.   Hinda Kehr, MD 11/11/21 [phone]

## 2021-11-11 NOTE — ED Notes (Signed)
Pt resting at this time. Pt is in NAD.  °

## 2021-11-11 NOTE — ED Triage Notes (Signed)
Pt st took tylenol yesterday at 6pm; at 8pm noted itching & hives; awoke PTA swith swelling noted to left side lip/face, voice hoarseness noted; took benadryl at 9pm without relief

## 2021-11-11 NOTE — Discharge Instructions (Signed)
You have been seen in the Emergency Department (ED) today for an allergic reaction.  You have been stable throughout your stay in the Emergency Department.  Please take your medications as prescribed and follow up with your doctor as indicated.  You should also take over-the-counter cetirizine (Zyrtec) or other allergy medication around the clock for the next three days according to the dosing instructions on the package.  Please keep your Epi-Pen with you at all times and use it if experience shortness of breath or difficulty breathing or if you believe you are having a severe allergic reaction.  If you use the Epi-Pen, though, please call 911 afterwards or go immediately to your nearest Emergency Department.  Return to the Emergency Department (ED) if you experience any worsening or new symptoms that concern you.

## 2021-11-11 NOTE — ED Notes (Signed)
Signature pad not working at this time. Discharge instructions and prescriptions reviewed with pt, pt verbalized understanding and denies any questions. Pt ambulatory upon discharge.

## 2023-04-19 ENCOUNTER — Other Ambulatory Visit: Payer: Self-pay

## 2023-04-19 ENCOUNTER — Emergency Department: Payer: BC Managed Care – PPO

## 2023-04-19 ENCOUNTER — Emergency Department
Admission: EM | Admit: 2023-04-19 | Discharge: 2023-04-19 | Disposition: A | Discharge status: Home / Self Care | Payer: BC Managed Care – PPO | Attending: Emergency Medicine | Admitting: Emergency Medicine

## 2023-04-19 DIAGNOSIS — Y9301 Activity, walking, marching and hiking: Secondary | ICD-10-CM | POA: Insufficient documentation

## 2023-04-19 DIAGNOSIS — S93401A Sprain of unspecified ligament of right ankle, initial encounter: Secondary | ICD-10-CM | POA: Diagnosis not present

## 2023-04-19 DIAGNOSIS — Y92009 Unspecified place in unspecified non-institutional (private) residence as the place of occurrence of the external cause: Secondary | ICD-10-CM | POA: Insufficient documentation

## 2023-04-19 DIAGNOSIS — X501XXA Overexertion from prolonged static or awkward postures, initial encounter: Secondary | ICD-10-CM | POA: Diagnosis not present

## 2023-04-19 DIAGNOSIS — M25571 Pain in right ankle and joints of right foot: Secondary | ICD-10-CM | POA: Diagnosis present

## 2023-04-19 MED ORDER — IBUPROFEN 600 MG PO TABS
600.0000 mg | ORAL_TABLET | Freq: Once | ORAL | Status: AC
  Administered 2023-04-19: 600 mg via ORAL
  Filled 2023-04-19: qty 1

## 2023-04-19 MED ORDER — IBUPROFEN 600 MG PO TABS: 600.0000 mg | ORAL_TABLET | Freq: Three times a day (TID) | ORAL | 0 refills | Status: AC | PRN

## 2023-04-19 MED ORDER — MELOXICAM 15 MG PO TABS: 15.0000 mg | ORAL_TABLET | Freq: Every day | ORAL | 0 refills | Status: AC

## 2023-04-19 NOTE — ED Triage Notes (Signed)
 First Nurse Note: Patient to ED via [facility] from home for a fall. Pt was walking her dog when he took off causing her fall. Pt reports hearing crack in right leg. Pain starts in ankle and shoots up into leg. Denies hitting head or LOC.

## 2023-04-19 NOTE — Discharge Instructions (Addendum)
 You have been diagnosed with ankle sprain.  Please use cryotherapy, elevate the right ankle.  Please take meloxicam 1 tablet by mouth daily for 7 days.  Please take ibuprofen 1 tablet 600 mg every 8 hours after main meals.  Call orthopedics, Dr. Joice Lofts make an appointment for a follow-up.  Please use the boot.  Come back to ED or go to your PCP if you have new symptoms and symptoms worsen.

## 2023-04-19 NOTE — ED Triage Notes (Signed)
 Pt here via [facility] after a fall while walking her dog. Pt landed on her right leg. Pt states she is having pain from her ankle to her hip.

## 2023-04-19 NOTE — ED Provider Notes (Signed)
 [facility] Provider Note    Event Date/Time   First MD Initiated Contact with Patient 04/19/23 1548     (approximate)   History   Leg Pain   HPI  Charlene Crosby is a 28 y.o. female who presents today with history of fall while going down the hill at her house with a dog.  Patient states she heard 2 pops when she fell.  Patient was by herself at home and called EMS.     Physical Exam   Triage Vital Signs: ED Triage Vitals  Encounter Vitals Group     BP 04/19/23 1435 (!) 147/92     Systolic BP Percentile --      Diastolic BP Percentile --      Pulse Rate 04/19/23 1435 (!) 106     Resp 04/19/23 1435 17     Temp 04/19/23 1435 98.7 F (37.1 C)     Temp Source 04/19/23 1435 Oral     SpO2 04/19/23 1435 100 %     Weight 04/19/23 1433 240 lb 15.4 oz (109.3 kg)     Height 04/19/23 1433 5\' 5"  (1.651 m)     Head Circumference --      Peak Flow --      Pain Score 04/19/23 1433 10     Pain Loc --      Pain Education --      Exclude from Growth Chart --     Most recent vital signs: Vitals:   04/19/23 1435  BP: (!) 147/92  Pulse: (!) 106  Resp: 17  Temp: 98.7 F (37.1 C)  SpO2: 100%     Constitutional: Alert, mild distress Eyes: Conjunctivae are normal.  Head: Atraumatic. Nose: No congestion/rhinnorhea. Mouth/Throat: Mucous membranes are moist.   Neck: Painless ROM.  Cardiovascular:   Good peripheral circulation. Respiratory: Normal respiratory effort.  No retractions.  Gastrointestinal: Soft and nontender.  Musculoskeletal:  no deformity Right ankle: Skin intact, edema.  No ecchymosis or hematoma.  Tender to palpation in the lateral and posterior ankle.  Thompson test negative.  Full ROM limited by pain. Neurologic:  MAE spontaneously. No gross focal neurologic deficits are appreciated.  Skin:  Skin is warm, dry and intact. No rash noted. Psychiatric: Mood and affect are normal. Speech and behavior are normal.    ED Results /  Procedures / Treatments   Labs (all labs ordered are listed, but only abnormal results are displayed) Labs Reviewed - No data to display   EKG     RADIOLOGY I independently reviewed and interpreted imaging and agree with radiologists findings.      PROCEDURES:  Critical Care performed:   Procedures   MEDICATIONS ORDERED IN ED: Medications  ibuprofen (ADVIL) tablet 600 mg (600 mg Oral Given 04/19/23 1710)     IMPRESSION / MDM / ASSESSMENT AND PLAN / ED COURSE  I reviewed the triage vital signs and the nursing notes.  Differential diagnosis includes, but is not limited to, Achilles injury, muscle strain,, fracture  Patient's presentation is most consistent with acute complicated illness / injury requiring diagnostic workup.   Patient's diagnosis is consistent with muscle strain,. I independently reviewed and interpreted imaging and agree with radiologists findings ruling out fracture.I did review the patient's allergies and medications.The patient is in stable and satisfactory condition for discharge home  Patient will be discharged home with prescriptions for meloxicam, ibuprofen.  I advised patient to apply cryotherapy, elevate the right leg.  Patient will be  discharged with a right ankle boot and crutches. Patient is to follow up with orthopedics as needed or otherwise directed. Patient is given ED precautions to return to the ED for any worsening or new symptoms. Discussed plan of care with patient, answered all of patient's questions, Patient agreeable to plan of care. Advised patient to take medications according to the instructions on the label. Discussed possible side effects of new medications. Patient verbalized understanding. Clinical Course as of 04/19/23 1737  Wed Apr 19, 2023  1652 DG Ankle Complete Right No acute or significant finding by plain radiography [AE]    Clinical Course User Index [AE] Gladys Damme, PA-C     FINAL CLINICAL IMPRESSION(S) /  ED DIAGNOSES   Final diagnoses:  Sprain of right ankle, unspecified ligament, initial encounter     Rx / DC Orders   ED Discharge Orders          Ordered    ibuprofen (ADVIL) 600 MG tablet  Every 8 hours PRN        04/19/23 1733    meloxicam (MOBIC) 15 MG tablet  Daily        04/19/23 1733             Note:  This document was prepared using Dragon voice recognition software and may include unintentional dictation errors.   Gladys Damme, PA-C 04/19/23 1738    Delton [place], MD 04/19/23 2106

## 2023-04-19 NOTE — ED Notes (Signed)
 Assisted patient to restroom.

## 2023-10-24 ENCOUNTER — Other Ambulatory Visit: Payer: Self-pay | Admitting: Obstetrics and Gynecology

## 2023-10-24 DIAGNOSIS — N63 Unspecified lump in unspecified breast: Secondary | ICD-10-CM
